# Patient Record
Sex: Female | Born: 1975 | Race: White | Hispanic: No | Marital: Married | Smoking: Current every day smoker
Health system: Southern US, Community
[De-identification: ages and names within clinical notes are randomized; demographics above are authoritative.]

## PROBLEM LIST (undated history)

## (undated) ENCOUNTER — Emergency Department (HOSPITAL_COMMUNITY): Admission: EM | Payer: Self-pay | Source: Home / Self Care

## (undated) DIAGNOSIS — F32A Depression, unspecified: Secondary | ICD-10-CM

## (undated) DIAGNOSIS — F419 Anxiety disorder, unspecified: Secondary | ICD-10-CM

## (undated) DIAGNOSIS — F329 Major depressive disorder, single episode, unspecified: Secondary | ICD-10-CM

## (undated) DIAGNOSIS — F99 Mental disorder, not otherwise specified: Secondary | ICD-10-CM

## (undated) DIAGNOSIS — F319 Bipolar disorder, unspecified: Secondary | ICD-10-CM

## (undated) HISTORY — PX: ABDOMINAL HYSTERECTOMY: SHX81

## (undated) HISTORY — PX: CHOLECYSTECTOMY: SHX55

---

## 2004-01-04 HISTORY — PX: ABDOMINAL HYSTERECTOMY: SHX81

## 2010-01-17 ENCOUNTER — Emergency Department: Payer: Self-pay | Admitting: Emergency Medicine

## 2010-04-30 ENCOUNTER — Observation Stay: Payer: Self-pay | Admitting: Internal Medicine

## 2011-02-24 ENCOUNTER — Inpatient Hospital Stay: Payer: Self-pay | Admitting: Psychiatry

## 2011-02-24 LAB — COMPREHENSIVE METABOLIC PANEL
Albumin: 3.8 g/dL (ref 3.4–5.0)
Alkaline Phosphatase: 74 U/L (ref 50–136)
Anion Gap: 13 (ref 7–16)
Bilirubin,Total: 0.4 mg/dL (ref 0.2–1.0)
Calcium, Total: 8.7 mg/dL (ref 8.5–10.1)
EGFR (African American): >60
Glucose: 99 mg/dL (ref 65–99)
Osmolality: 284 (ref 275–301)
Potassium: 3.8 mmol/L (ref 3.5–5.1)
SGOT(AST): 26 U/L (ref 15–37)
SGPT (ALT): 31 U/L
Total Protein: 8.2 g/dL (ref 6.4–8.2)

## 2011-02-24 LAB — URINALYSIS, COMPLETE
Glucose,UR: NEGATIVE mg/dL (ref 0–75)
Nitrite: NEGATIVE
Ph: 7 (ref 4.5–8.0)
Ph: 5 (ref 4.5–8.0)
Protein: 30
Specific Gravity: 1.025 (ref 1.003–1.030)
Specific Gravity: 1.03 (ref 1.003–1.030)
WBC UR: 2 /HPF (ref 0–5)

## 2011-02-24 LAB — CBC
HGB: 14.2 g/dL (ref 12.0–16.0)
MCH: 29.7 pg (ref 26.0–34.0)
Platelet: 277 10*3/uL (ref 150–440)
RBC: 4.78 10*6/uL (ref 3.80–5.20)
WBC: 13.3 10*3/uL — ABNORMAL HIGH (ref 3.6–11.0)

## 2011-02-24 LAB — DRUG SCREEN, URINE
Benzodiazepine, Ur Scrn: NEGATIVE (ref ?–200)
Cocaine Metabolite,Ur ~~LOC~~: NEGATIVE (ref ?–300)
MDMA (Ecstasy)Ur Screen: NEGATIVE (ref ?–500)

## 2011-02-24 LAB — TSH: Thyroid Stimulating Horm: 0.47 u[IU]/mL

## 2011-02-25 LAB — BEHAVIORAL MEDICINE 1 PANEL
Albumin: 3.4 g/dL (ref 3.4–5.0)
BUN: 14 mg/dL (ref 7–18)
Basophil %: 0.7 %
Bilirubin,Total: 0.4 mg/dL (ref 0.2–1.0)
Chloride: 106 mmol/L (ref 98–107)
Creatinine: 0.72 mg/dL (ref 0.60–1.30)
Eosinophil %: 3.5 %
HCT: 41.9 % (ref 35.0–47.0)
HGB: 13.9 g/dL (ref 12.0–16.0)
Monocyte #: 0.7 10*3/uL (ref 0.0–0.7)
Monocyte %: 6 %
Neutrophil %: 56.2 %
Osmolality: 280 (ref 275–301)
Potassium: 3.4 mmol/L — ABNORMAL LOW (ref 3.5–5.1)
RBC: 4.65 10*6/uL (ref 3.80–5.20)
SGOT(AST): 22 U/L (ref 15–37)
Sodium: 140 mmol/L (ref 136–145)
Thyroid Stimulating Horm: 0.555 u[IU]/mL
Total Protein: 7.3 g/dL (ref 6.4–8.2)
WBC: 11.4 10*3/uL — ABNORMAL HIGH (ref 3.6–11.0)

## 2011-03-13 ENCOUNTER — Emergency Department: Payer: Self-pay | Admitting: *Deleted

## 2011-03-13 LAB — COMPREHENSIVE METABOLIC PANEL
Albumin: 3.9 g/dL (ref 3.4–5.0)
Alkaline Phosphatase: 71 U/L (ref 50–136)
Bilirubin,Total: 0.2 mg/dL (ref 0.2–1.0)
Creatinine: 0.78 mg/dL (ref 0.60–1.30)
Glucose: 85 mg/dL (ref 65–99)
SGOT(AST): 19 U/L (ref 15–37)
SGPT (ALT): 21 U/L
Total Protein: 8 g/dL (ref 6.4–8.2)

## 2011-03-13 LAB — CBC
HCT: 40.1 % (ref 35.0–47.0)
HGB: 13.7 g/dL (ref 12.0–16.0)
Platelet: 277 10*3/uL (ref 150–440)
RDW: 14.6 % — ABNORMAL HIGH (ref 11.5–14.5)

## 2011-03-13 LAB — DRUG SCREEN, URINE
Amphetamines, Ur Screen: NEGATIVE (ref ?–1000)
Barbiturates, Ur Screen: NEGATIVE (ref ?–200)
Benzodiazepine, Ur Scrn: NEGATIVE (ref ?–200)
Cannabinoid 50 Ng, Ur ~~LOC~~: NEGATIVE (ref ?–50)
Cocaine Metabolite,Ur ~~LOC~~: NEGATIVE (ref ?–300)
MDMA (Ecstasy)Ur Screen: NEGATIVE (ref ?–500)
Tricyclic, Ur Screen: NEGATIVE (ref ?–1000)

## 2011-03-13 LAB — URINALYSIS, COMPLETE
Bacteria: NONE SEEN
Bilirubin,UR: NEGATIVE
Blood: NEGATIVE
Glucose,UR: NEGATIVE mg/dL (ref 0–75)
Ketone: NEGATIVE
Leukocyte Esterase: NEGATIVE
Protein: NEGATIVE
RBC,UR: 6 /HPF (ref 0–5)
Squamous Epithelial: 26
WBC UR: 1 /HPF (ref 0–5)

## 2011-03-13 LAB — TSH: Thyroid Stimulating Horm: 1.22 u[IU]/mL

## 2011-05-19 ENCOUNTER — Ambulatory Visit: Payer: Self-pay

## 2011-09-05 ENCOUNTER — Inpatient Hospital Stay (HOSPITAL_COMMUNITY)
Admission: AD | Admit: 2011-09-05 | Discharge: 2011-09-09 | DRG: 885 | Disposition: A | Discharge status: Home / Self Care | Payer: Federal, State, Local not specified - Other | Attending: Psychiatry | Admitting: Psychiatry

## 2011-09-05 ENCOUNTER — Encounter (HOSPITAL_COMMUNITY): Payer: Self-pay | Admitting: *Deleted

## 2011-09-05 ENCOUNTER — Emergency Department: Payer: Self-pay | Admitting: Internal Medicine

## 2011-09-05 DIAGNOSIS — F1010 Alcohol abuse, uncomplicated: Secondary | ICD-10-CM | POA: Diagnosis present

## 2011-09-05 DIAGNOSIS — Z79899 Other long term (current) drug therapy: Secondary | ICD-10-CM

## 2011-09-05 DIAGNOSIS — F319 Bipolar disorder, unspecified: Secondary | ICD-10-CM

## 2011-09-05 DIAGNOSIS — F3130 Bipolar disorder, current episode depressed, mild or moderate severity, unspecified: Principal | ICD-10-CM | POA: Diagnosis present

## 2011-09-05 DIAGNOSIS — E785 Hyperlipidemia, unspecified: Secondary | ICD-10-CM | POA: Diagnosis present

## 2011-09-05 DIAGNOSIS — E669 Obesity, unspecified: Secondary | ICD-10-CM | POA: Diagnosis present

## 2011-09-05 DIAGNOSIS — F3181 Bipolar II disorder: Secondary | ICD-10-CM | POA: Diagnosis present

## 2011-09-05 HISTORY — DX: Major depressive disorder, single episode, unspecified: F32.9

## 2011-09-05 HISTORY — DX: Mental disorder, not otherwise specified: F99

## 2011-09-05 HISTORY — DX: Depression, unspecified: F32.A

## 2011-09-05 LAB — URINALYSIS, COMPLETE
Bacteria: NONE SEEN
Glucose,UR: NEGATIVE mg/dL (ref 0–75)
Leukocyte Esterase: NEGATIVE
Nitrite: NEGATIVE

## 2011-09-05 LAB — COMPREHENSIVE METABOLIC PANEL
Albumin: 3.8 g/dL (ref 3.4–5.0)
Alkaline Phosphatase: 108 U/L (ref 50–136)
BUN: 5 mg/dL — ABNORMAL LOW (ref 7–18)
Calcium, Total: 9.2 mg/dL (ref 8.5–10.1)
Glucose: 101 mg/dL — ABNORMAL HIGH (ref 65–99)
SGOT(AST): 20 U/L (ref 15–37)
SGPT (ALT): 23 U/L (ref 12–78)
Total Protein: 8.2 g/dL (ref 6.4–8.2)

## 2011-09-05 LAB — DRUG SCREEN, URINE
Amphetamines, Ur Screen: NEGATIVE (ref ?–1000)
Barbiturates, Ur Screen: NEGATIVE (ref ?–200)
Benzodiazepine, Ur Scrn: NEGATIVE (ref ?–200)
Cocaine Metabolite,Ur ~~LOC~~: NEGATIVE (ref ?–300)
MDMA (Ecstasy)Ur Screen: NEGATIVE (ref ?–500)
Methadone, Ur Screen: NEGATIVE (ref ?–300)
Opiate, Ur Screen: NEGATIVE (ref ?–300)
Phencyclidine (PCP) Ur S: NEGATIVE (ref ?–25)

## 2011-09-05 LAB — CBC
HGB: 14.3 g/dL (ref 12.0–16.0)
MCH: 30.3 pg (ref 26.0–34.0)
MCHC: 34.4 g/dL (ref 32.0–36.0)
MCV: 88 fL (ref 80–100)
RBC: 4.72 10*6/uL (ref 3.80–5.20)

## 2011-09-05 LAB — ACETAMINOPHEN LEVEL: Acetaminophen: <2 ug/mL

## 2011-09-05 LAB — PREGNANCY, URINE: Pregnancy Test, Urine: NEGATIVE m[IU]/mL

## 2011-09-05 LAB — TSH: Thyroid Stimulating Horm: 0.643 u[IU]/mL

## 2011-09-05 MED ORDER — CHLORDIAZEPOXIDE HCL 25 MG PO CAPS: 25.0000 mg | ORAL_CAPSULE | ORAL | Status: DC

## 2011-09-05 MED ORDER — CHLORDIAZEPOXIDE HCL 25 MG PO CAPS: 25.0000 mg | ORAL_CAPSULE | Freq: Every day | ORAL | Status: DC

## 2011-09-05 MED ORDER — CHLORDIAZEPOXIDE HCL 25 MG PO CAPS: 25.0000 mg | ORAL_CAPSULE | Freq: Four times a day (QID) | ORAL | Status: AC | PRN

## 2011-09-05 MED ORDER — GABAPENTIN 600 MG PO TABS
600.0000 mg | ORAL_TABLET | Freq: Every day | ORAL | Status: DC
  Administered 2011-09-05: 600 mg via ORAL
  Filled 2011-09-05 (×3): qty 1

## 2011-09-05 MED ORDER — VENLAFAXINE HCL ER 150 MG PO CP24
150.0000 mg | ORAL_CAPSULE | Freq: Every day | ORAL | Status: DC
  Administered 2011-09-06 – 2011-09-09 (×4): 150 mg via ORAL
  Filled 2011-09-05 (×5): qty 1

## 2011-09-05 MED ORDER — CHLORDIAZEPOXIDE HCL 25 MG PO CAPS
25.0000 mg | ORAL_CAPSULE | Freq: Once | ORAL | Status: AC
  Administered 2011-09-05: 25 mg via ORAL

## 2011-09-05 MED ORDER — VITAMIN B-1 100 MG PO TABS
100.0000 mg | ORAL_TABLET | Freq: Every day | ORAL | Status: DC
  Administered 2011-09-06 – 2011-09-09 (×4): 100 mg via ORAL
  Filled 2011-09-05 (×5): qty 1

## 2011-09-05 MED ORDER — RISPERIDONE 1 MG PO TABS
1.0000 mg | ORAL_TABLET | Freq: Every day | ORAL | Status: DC
  Administered 2011-09-05: 1 mg via ORAL
  Filled 2011-09-05 (×4): qty 1

## 2011-09-05 MED ORDER — ALUM & MAG HYDROXIDE-SIMETH 200-200-20 MG/5ML PO SUSP
30.0000 mL | ORAL | Status: DC | PRN
  Administered 2011-09-08 – 2011-09-09 (×4): 30 mL via ORAL

## 2011-09-05 MED ORDER — THIAMINE HCL 100 MG/ML IJ SOLN: 100.0000 mg | Freq: Once | INTRAMUSCULAR | Status: DC

## 2011-09-05 MED ORDER — MAGNESIUM HYDROXIDE 400 MG/5ML PO SUSP: 30.0000 mL | Freq: Every day | ORAL | Status: DC | PRN

## 2011-09-05 MED ORDER — TRAZODONE HCL 100 MG PO TABS
100.0000 mg | ORAL_TABLET | Freq: Every evening | ORAL | Status: DC | PRN
  Administered 2011-09-08: 100 mg via ORAL
  Filled 2011-09-05: qty 14
  Filled 2011-09-05: qty 1

## 2011-09-05 MED ORDER — CHLORDIAZEPOXIDE HCL 25 MG PO CAPS: 25.0000 mg | ORAL_CAPSULE | Freq: Three times a day (TID) | ORAL | Status: DC

## 2011-09-05 MED ORDER — GABAPENTIN 600 MG PO TABS
300.0000 mg | ORAL_TABLET | Freq: Two times a day (BID) | ORAL | Status: DC
  Filled 2011-09-05 (×3): qty 0.5

## 2011-09-05 MED ORDER — ADULT MULTIVITAMIN W/MINERALS CH
1.0000 | ORAL_TABLET | Freq: Every day | ORAL | Status: DC
  Administered 2011-09-06 – 2011-09-09 (×4): 1 via ORAL
  Filled 2011-09-05 (×5): qty 1

## 2011-09-05 MED ORDER — ONDANSETRON 4 MG PO TBDP: 4.0000 mg | ORAL_TABLET | Freq: Four times a day (QID) | ORAL | Status: AC | PRN

## 2011-09-05 MED ORDER — CHLORDIAZEPOXIDE HCL 25 MG PO CAPS
25.0000 mg | ORAL_CAPSULE | Freq: Four times a day (QID) | ORAL | Status: DC
  Administered 2011-09-06: 25 mg via ORAL
  Filled 2011-09-05 (×2): qty 1

## 2011-09-05 MED ORDER — HYDROXYZINE HCL 25 MG PO TABS: 25.0000 mg | ORAL_TABLET | Freq: Four times a day (QID) | ORAL | Status: DC | PRN

## 2011-09-05 MED ORDER — ACETAMINOPHEN 325 MG PO TABS
650.0000 mg | ORAL_TABLET | Freq: Four times a day (QID) | ORAL | Status: DC | PRN
  Administered 2011-09-06: 650 mg via ORAL

## 2011-09-05 MED ORDER — LOPERAMIDE HCL 2 MG PO CAPS: 2.0000 mg | ORAL_CAPSULE | ORAL | Status: AC | PRN

## 2011-09-05 NOTE — BH Assessment (Signed)
Assessment Note   [name] is an 36 y.o. female. Patient brought into [facility] emergency department by husband.   Admits to drinking half a bottle of wine today, 2--40 ounce beers yesterday and 1/2 pint of vodka over the two days prior.  I can't help myself, I do nothing all day, I cook if I feel like it, I threw a coffee cup that my husband, I feel sorry for him, something's wrong with me, no one can help me.  Dr. Lenis Noon tried, but I don't listen to him at all.  Patients sat with the eyes closed the entire time she spoke current plan to kill herself by blowing her brains out and report although there is no gun at home she knows where to find one. History of self harm; One month ago she overdosed, no cutting in the past six months, no burning in the past six months. (scars on R hand and forearm). The patient is disheveled, poor hygiene, poor eye contact ,irritable, eyes closed during interview, reports difficulty falling asleep, reports mood swings, anxious, labile.  Patient is blaming, fearful and hopeless.  Patient is indecisive has some perceveration, and very poor concentration her speech is slow and deliberate her distant memory is intact but frequently asks if it is time to go home or why she is staying, agreeable when reminded. Denies having any hallucinations. Her energy is low, reports gaining 10 pounds in the past two months. She is oriented times 3. She feels that she does not have any support system.  Patients states "I don't know what happens, this feeling just comes over me, sometimes I cut myself with a box cutter or burn myself with a cigarette, it makes me feel better sometimes.  I drank 1/2 bottle of wine at 11:00 AM, sometimes it makes me feel better, I'd rather blow my brains out when I feel like this" Patient reports getting this weird feeling, it comes over me.  Patient voices suicidal ideation relating that one month ago she overdosed on a bunch of pills at  home and then just went to sleep.  Patient lives with husband of 15 years and three children age 55, 40 and 109 years old.  She relates that when she gets these weird feelings she can't stand it and drinks daily to make them go away.  Denies any family history of suicide or mental illness. The patient accepted for inpatient treatment by Morley Kos M.D.   Axis I: Alcohol Abuse and Bipolar, mixed Axis II: Deferred Axis III:  Past Medical History  Diagnosis Date  . Mental disorder   . Depression    Axis IV: other psychosocial or environmental problems and problems with primary support group Axis V: 21-30 behavior considerably influenced by delusions or hallucinations OR serious impairment in judgment, communication OR inability to function in almost all areas  Past Medical History:  Past Medical History  Diagnosis Date  . Mental disorder   . Depression     No past surgical history on file.  Family History: No family history on file.  Social History:  does not have a smoking history on file. She does not have any smokeless tobacco history on file. She reports that she drinks alcohol. She reports that she does not use illicit drugs.  Additional Social History:  Alcohol / Drug Use Pain Medications: not abusing has high salicilate level 4.4 Prescriptions: not taking risperdal and effexor recently Over the Counter: nos History of alcohol / drug  use?: Yes Substance #1 Name of Substance 1: alcohol 1 - Age of First Use: teens 1 - Amount (size/oz): varies  09/05/11-- 1/2 bottle of wine, 09/04/2011-- 2 40oz beers, 09/03/2011-- 1/2 pint vodka 1 - Frequency: most days 1 - Duration: recently 1 - Last Use / Amount: 09/05/2011  CIWA: CIWA-Ar Nausea and Vomiting: no nausea and no vomiting Tactile Disturbances: none Tremor: no tremor Auditory Disturbances: mild harshness or ability to frighten Paroxysmal Sweats: no sweat visible Visual Disturbances: not present Anxiety: three Headache,  Fullness in Head: none present Agitation: three Orientation and Clouding of Sensorium: oriented and can do serial additions CIWA-Ar Total: 8  COWS:    Allergies: Allergies no known allergies  Home Medications:  (Not in a hospital admission)  OB/GYN Status:  No LMP recorded. Patient has had a hysterectomy.  General Assessment Data Location of Assessment: [facility] Assessment Services Living Arrangements: Spouse/significant other;Children (3 children) Can pt return to current living arrangement?: Yes Admission Status: Involuntary Is patient capable of signing voluntary admission?: No Transfer from: Acute Hospital Referral Source: MD  Education Status Is patient currently in school?: No  Risk to self Suicidal Ideation: Yes-Currently Present Suicidal Intent: Yes-Currently Present Is patient at risk for suicide?: Yes Suicidal Plan?: Yes-Currently Present Specify Current Suicidal Plan: blow my brains out Access to Means: Yes Specify Access to Suicidal Means: reports she knows where to get a gun What has been your use of drugs/alcohol within the last 12 months?: alcohol when I feel like this Previous Attempts/Gestures: Yes How many times?: 5  Other Self Harm Risks: History of ODs, cutter Triggers for Past Attempts: Other (Comment) (illness--wierd feeling she can not tolerate) Intentional Self Injurious Behavior: Cutting;Burning Comment - Self Injurious Behavior: Hx of cutting with box cutter and burning with cigarettes Family Suicide History: Unknown Recent stressful life event(s):  (none reported) Persecutory voices/beliefs?: No Depression: Yes Depression Symptoms: Despondent;Insomnia;Tearfulness;Fatigue;Guilt;Feeling worthless/self pity Substance abuse history and/or treatment for substance abuse?: Yes Suicide prevention information given to non-admitted patients: Not applicable  Risk to Others Homicidal Ideation: No Thoughts of Harm to Others: No Current Homicidal Intent:  No Current Homicidal Plan: No Access to Homicidal Means: No History of harm to others?: No Assessment of Violence: None Noted Does patient have access to weapons?: Yes (Comment) (she reports knowing where a gun is) Criminal Charges Pending?: No Does patient have a court date: No  Psychosis Hallucinations: None noted Delusions: None noted  Mental Status Report Appear/Hygiene: Disheveled;Poor hygiene Eye Contact: Poor (kept eyes closed) Motor Activity: Unremarkable Speech: Soft;Logical/coherent (fearful, irritable, some perseverating) Level of Consciousness: Quiet/awake Mood: Depressed;Anxious;Labile;Irritable Affect: Anxious;Irritable;Labile;Depressed Anxiety Level: Moderate Thought Processes: Circumstantial Judgement: Impaired Orientation: Person;Place;Time Obsessive Compulsive Thoughts/Behaviors: None  Cognitive Functioning Concentration: Decreased Memory: Remote Intact;Recent Impaired (trouble remembering why she can not go home) IQ: Average Insight: Poor Impulse Control: Poor Appetite: Good Weight Loss: 0  Weight Gain: 10  (in 2 months) Sleep: Decreased Total Hours of Sleep: 6  Vegetative Symptoms: Not bathing;Decreased grooming  ADLScreening [facility] Assessment Services) Patient's cognitive ability adequate to safely complete daily activities?: Yes Patient able to express need for assistance with ADLs?: Yes Independently performs ADLs?: Yes (appropriate for developmental age)     Prior Inpatient Therapy Prior Inpatient Therapy: Yes Prior Therapy Dates: nos Prior Therapy Facilty/Provider(s): nos Reason for Treatment: SI  Prior Outpatient Therapy Prior Outpatient Therapy: Yes Prior Therapy Dates: current Prior Therapy Facilty/Provider(s): [facility]/ Dr Lenis Noon Reason for Treatment: mood disorder, bipolar  ADL Screening (condition at time of admission) Patient's  cognitive ability adequate to safely complete daily activities?: Yes Patient able to express need for  assistance with ADLs?: Yes Independently performs ADLs?: Yes (appropriate for developmental age) Weakness of Legs: None Weakness of Arms/Hands: None  Home Assistive Devices/Equipment Home Assistive Devices/Equipment: None          Advance Directives (For Healthcare) Advance Directive: Patient does not have advance directive Pre-existing out of facility DNR order (yellow form or pink MOST form): No Nutrition Screen- [facility] Adult/WL/AP Patient's home diet: Regular Have you recently lost weight without trying?: No Have you been eating poorly because of a decreased appetite?: No Malnutrition Screening Tool Score: 0   Additional Information 1:1 In Past 12 Months?:  (current in ED) CIRT Risk: No Elopement Risk: No Does patient have medical clearance?: Yes     Disposition:  Disposition Disposition of Patient: Inpatient treatment program Type of inpatient treatment program: Adult  On Site Evaluation by:   Reviewed with Physician:     Conan Bowens 09/05/2011 8:07 PM

## 2011-09-05 NOTE — Tx Team (Signed)
Initial Interdisciplinary Treatment Plan  PATIENT STRENGTHS: (choose at least two) Ability for insight Active sense of humor Average or above average intelligence Capable of independent living Communication skills General fund of knowledge Motivation for treatment/growth Physical Health Religious Affiliation Supportive family/friends Work skills  PATIENT STRESSORS: Financial difficulties Loss of dad 3 yrs ago* Medication change or noncompliance Occupational concerns Substance abuse   PROBLEM LIST: Problem List/Patient Goals Date to be addressed Date deferred Reason deferred Estimated date of resolution  "I just wanna feel good for one day" 09/05/11     "My ability to cope with things, basic every day things" 09/05/11           Anxiety 09/05/11     depression 09/05/11     Increased risk for suicide 09/05/11     etoh dependence 09/05/11                  DISCHARGE CRITERIA:  Ability to meet basic life and health needs Adequate post-discharge living arrangements Improved stabilization in mood, thinking, and/or behavior Medical problems require only outpatient monitoring Motivation to continue treatment in a less acute level of care Need for constant or close observation no longer present  PRELIMINARY DISCHARGE PLAN: Attend aftercare/continuing care group Attend 12-step recovery group Outpatient therapy Participate in family therapy Return to previous living arrangement Return to previous work or school arrangements  PATIENT/FAMIILY INVOLVEMENT: This treatment plan has been presented to and reviewed with the patient, [name], and/or family member.  The patient and family have been given the opportunity to ask questions and make suggestions.  Fr[facility] 09/05/2011, 9:38 PM

## 2011-09-06 DIAGNOSIS — F3181 Bipolar II disorder: Secondary | ICD-10-CM | POA: Diagnosis present

## 2011-09-06 LAB — BASIC METABOLIC PANEL
BUN: 11 mg/dL (ref 6–23)
Calcium: 9.3 mg/dL (ref 8.4–10.5)
GFR calc non Af Amer: >90 mL/min (ref >90)
Glucose, Bld: 96 mg/dL (ref 70–99)

## 2011-09-06 LAB — HEPATIC FUNCTION PANEL
ALT: 13 U/L (ref 0–35)
AST: 17 U/L (ref 0–37)
Albumin: 3.5 g/dL (ref 3.5–5.2)
Alkaline Phosphatase: 83 U/L (ref 39–117)
Total Bilirubin: 0.2 mg/dL — ABNORMAL LOW (ref 0.3–1.2)

## 2011-09-06 MED ORDER — DIVALPROEX SODIUM ER 500 MG PO TB24
500.0000 mg | ORAL_TABLET | Freq: Every day | ORAL | Status: DC
  Administered 2011-09-06 – 2011-09-09 (×4): 500 mg via ORAL
  Filled 2011-09-06 (×6): qty 1

## 2011-09-06 MED ORDER — NICOTINE 21 MG/24HR TD PT24
21.0000 mg | MEDICATED_PATCH | Freq: Every day | TRANSDERMAL | Status: DC
  Administered 2011-09-06 – 2011-09-09 (×4): 21 mg via TRANSDERMAL
  Filled 2011-09-06 (×6): qty 1

## 2011-09-06 MED ORDER — GABAPENTIN 300 MG PO CAPS
600.0000 mg | ORAL_CAPSULE | Freq: Every day | ORAL | Status: DC
  Administered 2011-09-06 – 2011-09-08 (×3): 600 mg via ORAL
  Filled 2011-09-06 (×4): qty 2

## 2011-09-06 MED ORDER — GABAPENTIN 300 MG PO CAPS
300.0000 mg | ORAL_CAPSULE | Freq: Two times a day (BID) | ORAL | Status: DC
  Administered 2011-09-06 – 2011-09-09 (×8): 300 mg via ORAL
  Filled 2011-09-06 (×9): qty 1

## 2011-09-06 MED ORDER — RISPERIDONE 1 MG PO TABS
1.0000 mg | ORAL_TABLET | Freq: Two times a day (BID) | ORAL | Status: DC
  Administered 2011-09-06 – 2011-09-09 (×7): 1 mg via ORAL
  Filled 2011-09-06 (×8): qty 1

## 2011-09-06 NOTE — Progress Notes (Signed)
Patient ID: [name], female   DOB: [birth year], 36 y.o.   MRN: [id number] D: Pt is awake and active on the unit this PM. Pt denies SI/HI and A/V hallucinations. She endorses thoughts of self harm but is able to contracts for safety. Pt is participating in the milieu and is cooperative with staff. Pt rates their depression at 5 and hopelessness at 8. Pt's most recent CIWA score was 3.  A: Writer offered self, utilized therapeutic communication and administered medication per MD orders. Writer also encouraged pt to discuss feelings with staff and attend groups. Pt states that she is feeling better than yesterday but is still anxious. When she has had thoughts of self harm in the past she has cut or burned herself. Writer encouraged her to learn new coping skills here at [facility] to deal with emotional turmoil such as exercise instead of self harm.   R: Pt is attending groups and tolerating medications well. Writer will continue to monitor. 15 minute checks are ongoing for safety. Pt is receptive to staff input as well.

## 2011-09-06 NOTE — Progress Notes (Signed)
[facility] Group Notes:  (Counselor/Nursing/MHT/Case Management/Adjunct)   Type of Therapy:  Group Therapy 1:15 to 2:30 Tuesday 09/06/2011   Participation Level:  Active  Participation Quality:  Appropriate and Attentive  Affect:  Appropriate  Cognitive:  Appropriate  Insight:  Good  Engagement in Group:  Good  Engagement in Therapy:  Good  Modes of Intervention:  Education, Socialization and Support  Summary of Progress/Problems:Patient attended group presentation by Interior and spatial designer of  [employer] ([employer]). [name] was attentive and appropriate during session, her body language showed that she was able to relate to many aspects of guest speakers story.  During processing portion [name] shared how she was able to relate to description shared about bipolar experience.   Clide Dales 09/07/2011 10:52 AM

## 2011-09-06 NOTE — [facility] Suicide Risk Assessment (Signed)
Suicide Risk Assessment  Admission Assessment      Demographic factors:  Caucasian;Low socioeconomic status  Current Mental Status: Patient seen and evaluated. Chart reviewed. Patient stated that her mood was "ok".  Off meds in recent past.  Was taking Risperdal, Effexor, Lamotrigine and Neurontin. Her affect was mood congruent and labile. She denied any current thoughts of self injurious behavior, suicidal ideation or homicidal ideation at this time.  Hx agitation, SIB and aggression. There were no auditory or visual hallucinations, paranoia, delusional thought processes, or mania noted.  Thought process was linear and goal directed.  Mild psychomotor agitation noted. Speech was normal rate, tone and volume. Eye contact was good. Judgment and insight are limited.  Patient has been up and engaged on the unit.  No acute safety concerns reported from team.  No sig withdrawal s/s noted at this time, pt requested d/c of librium taper.  Loss Factors:  Financial problems / change in socioeconomic status;Decline in physical health (lost dad 3 yrs ago)  Historical Factors:  Impulsivity; Hx SI/attempts/SIB   Risk Reduction Factors:  Responsible for children under 43 years of age;Sense of responsibility to family;Religious beliefs about death;Employed;Positive social support;Living with another person, especially a relative; husband  CLINICAL FACTORS: BPAD; Alcohol Use Disorder; r/o PD NOS with Cluster B Traits  COGNITIVE FEATURES THAT CONTRIBUTE TO RISK: limited insight; impulsivity.  SUICIDE RISK: Pt viewed as a chronic increased risk of harm to self in light of her past hx and risk factors.  No acute safety concerns on the unit.  Pt contracting for safety and in need of crisis stabilization & Tx.  PLAN OF CARE: Pt admitted for crisis stabilization and treatment. PT requested start of Depakote over restart of Lamotrigine and increase in Risperdal for acute agitation.  Views Effexor and Neurontin as  helpful.  Please see orders.   Medications reviewed with pt and medication education provided. Will continue q15 minute checks per unit protocol.  No clinical indication for one on one level of observation at this time.  Pt contracting for safety.  Mental health treatment, medication management and continued sobriety will mitigate against the increased risk of harm to self and/or others.  Discussed the importance of recovery with pt, as well as, tools to move forward in a healthy & safe manner.  Pt agreeable with the plan.  Discussed with the team.   Lupe Carney 09/06/2011, 4:37 PM

## 2011-09-06 NOTE — Progress Notes (Signed)
[facility] Group Notes:  (Counselor/Nursing/MHT/Case Management/Adjunct)  09/06/2011 3:28 PM  Type of Therapy:  Psychoeducational Skills  Participation Level:  Did Not Attend  Summary of Progress/Problems: Jahliyah did not attend Psychoeducational group that focused on using quality time with support systems/individuals to engage in healthy coping skills.   Wandra Scot 09/06/2011, 3:28 PM

## 2011-09-06 NOTE — Progress Notes (Signed)
Patient ID: [name], female   DOB: 1975/10/31, 36 y.o.   MRN: [id number] She has been up and to most of the groups, interacting with peers and staff. sSays that she is feeling better at this time. On her self inventory this AM she indicated that she was having SI thoughts but when asked about in treatment  Team she denied it.  Has depression  And hopelessness of 5.

## 2011-09-06 NOTE — Progress Notes (Signed)
Psychoeducational Group Note  Date:  09/06/2011 Time:  1000  Group Topic/Focus:  Rediscovering Joy:   The focus of this group is to explore various ways to relieve stress in a positive manner.  Participation Level:  Active  Participation Quality:  Appropriate, Attentive and Sharing  Affect:  Appropriate  Cognitive:  Appropriate  Insight:  Good  Engagement in Group:  Good  Additional Comments:  Pt participated in recovery goals group. Pt defined recovery and what it would look like in his life. Pt was given a worksheet on my personal goals for recovery, and wrote down two changes that would be made in order to progress in recovery and what could be done differently that would promote healthy behaviors.  Pt stated that she would enjoy her hobbies more to keep herself busy.   [name] Brittini 09/06/2011, 2:57 PM
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
## 2011-09-06 NOTE — Progress Notes (Signed)
Patient ID: [name], female   DOB: 1975-04-02, 36 y.o.   MRN: [id number]  Pt was pleasant and cooperative, but anxious with pressured speech during the adm process. Pt stated she told her husband that she wanted help. Stated she made a remark referring to drastic measures rather than feel the way she has been feeling. Stated that she's always anxious and sometimes feels as if she's coming out of her skin. "I feel like people are out to get me and stress me out. Informed the writer that she was admitted invol because she decided she wanted to leave. Pt is a poor historian and somewhat guarded during the adm process.

## 2011-09-06 NOTE — H&P (Signed)
Psychiatric Admission Assessment Adult  Patient Identification:  [name] Date of Evaluation:  09/06/2011 Chief Complaint:  Bipolar Disorder, Manic History of Present Illness:: Pt is a 36 y/o WF, accepted from [facility] for inpatient mgmt of bipolar d/o (mixed) with suggestive manic episodes in setting of Alcohol abuse. Pt comes IVC and was medically screened and cleared per [place] HCP. Pt presents with continued manic events which are impulsive actions,that include mood swings, irritability, violent behaviors between her and her husband, crying spells and cutting on  herself to distract from the impulsiveness and mania that follows. Pt states she has had SI with intent/plan i.e. taking an O/D of medications. Pt denies HI, delusional thoughts or AVH and states she can contract for safety. Pt states she also periodically consumes increased volumes of alcohol to treat her mania in prior to cutting on herself.on ranges from a pint to a 5th of liquor consumed over two days with beer consumed alternately. Pt denies h.o of Dt's or S/z d/o related to her alcohol use. Pt sees Dr Lenis Noon at [facility] as outpatient and admittedly hasn't been compliant with her Rx psychotropic medications due to her medicaid running out. Pt is feeling depressed and admits to feelings of hopelessness, worthlessness and guilt. Pt also has periods of racing thoughts and decreased concentration, anhedonia and rates her depression sx a 5/10. Pt rates her anxiety sx a 5/10 and endorses periodic panic attacks. Reportedly pt stated to a nurse at [facility] that she felt like she could take a gun and just blow her brains out in lue of her stating she could contract for safety and denying SI/SA at this time.  ROS:   Psych : see HPI Endo: endorses HLD, obesity, denies DM, Thyroid d/z Pulm: denies SOB, cough, TB, PNA, hemoptysis Cardio: denies Htn, Cad, Murmur, CHF GI: denies N/V/D, pud, GERD, IBS, IBD Integument: denies rash,  lymphedema, skin CA  As per HPI, rest 12 point ROS negative  Mood Symptoms:  Anhedonia, Energy, Guilt, Helplessness, Hopelessness, Depression Symptoms:  depressed mood, difficulty concentrating, hopelessness, suicidal thoughts with specific plan, (Hypo) Manic Symptoms:  Elevated Mood, Impulsivity, Anxiety Symptoms:  Panic Symptoms, Psychotic Symptoms:  Paranoia,  PTSD Symptoms: none  Past Psychiatric History: Diagnosis:Bipolar D/o  Hospitalizations:  Outpatient Care:  Substance Abuse Care:  Self-Mutilation:  Suicidal Attempts:  Violent Behaviors:   Past Medical History:   Past Medical History  Diagnosis Date  . Mental disorder   . Depression    None. Allergies:  No Known Allergies PTA Medications: Prescriptions prior to admission  Medication Sig Dispense Refill  . risperiDONE (RISPERDAL) 1 MG tablet Take 1 mg by mouth at bedtime.      Marland Kitchen venlafaxine XR (EFFEXOR-XR) 150 MG 24 hr capsule Take 150 mg by mouth daily.      Marland Kitchen gabapentin (NEURONTIN) 300 MG capsule Take 300 mg by mouth 2 (two) times daily at 10 am and 4 pm.      . gabapentin (NEURONTIN) 600 MG tablet Take 600 mg by mouth at bedtime.      . lamoTRIgine (LAMICTAL) 100 MG tablet Take 100 mg by mouth daily.      . traZODone (DESYREL) 100 MG tablet Take 100 mg by mouth at bedtime as needed.        Previous Psychotropic Medications:  Medication/Dose                 Substance Abuse History in the last 12 months: Substance Age of 1st Use Last Use  Amount Specific Type  Nicotine      Alcohol      Cannabis      Opiates      Cocaine      Methamphetamines      LSD      Ecstasy      Benzodiazepines      Caffeine      Inhalants      Others:                         Consequences of Substance Abuse: Pt denies any consequences from increased alcohol consequences   Social History: Current Place of Residence:   Place of Birth:   Family Members: Marital Status:   Married Children:  Sons:  Daughters: Relationships: Education:  Corporate treasurer Problems/Performance: Religious Beliefs/Practices: History of Abuse (Emotional/Phsycial/Sexual) Occupational Experiences; Military History:  None. Legal History: Hobbies/Interests:  Family History:  No family history on file.  Mental Status Examination/Evaluation: Objective:  Appearance: Bizarre  Eye Contact::  Minimal  Speech:  Garbled  Volume:  Normal  Mood:  Anxious, Depressed, Hopeless and Worthless  Affect:  Depressed  Thought Process:  Disorganized  Orientation:  Full  Thought Content:  Rumination  Suicidal Thoughts:  Yes.  with intent/plan  Homicidal Thoughts:  No  Memory:  Immediate;   Fair  Judgement:  Poor  Insight:  Lacking  Psychomotor Activity:  Normal  Concentration:  Poor  Recall:  Poor  Akathisia:  No  Handed:  Right  AIMS (if indicated):     Assets:  Resilience  Sleep:       Laboratory/X-Ray Psychological Evaluation(s)      Assessment:    AXIS I:  Bipolar, Manic and alcohol abuse AXIS II:  No diagnosis AXIS III:  HLD Past Medical History  Diagnosis Date  . Mental disorder   . Depression    AXIS IV:  other psychosocial or environmental problems AXIS V:  11-20 some danger of hurting self or others possible OR occasionally fails to maintain minimal personal hygiene OR gross impairment in communication  Treatment Plan/Recommendations: 1) admit under safety and stability under IVC orders 2) substance abuse counseling and Alcohol detox and needed, 3) inpatient psychotherapy with psychotropics as deemed necessary     Treatment Plan Summary: Daily contact with patient to assess and evaluate symptoms and progress in treatment Current Medications:  Current Facility-Administered Medications  Medication Dose Route Frequency Provider Last Rate Last Dose  . acetaminophen (TYLENOL) tablet 650 mg  650 mg Oral Q6H PRN Alyson Kuroski-Mazzei, DO      . alum & mag  hydroxide-simeth (MAALOX/MYLANTA) 200-200-20 MG/5ML suspension 30 mL  30 mL Oral Q4H PRN Mickeal Skinner, MD      . chlordiazePOXIDE (LIBRIUM) capsule 25 mg  25 mg Oral Q6H PRN Alyson Kuroski-Mazzei, DO      . chlordiazePOXIDE (LIBRIUM) capsule 25 mg  25 mg Oral Once Mickeal Skinner, MD   25 mg at 09/05/11 2237  . chlordiazePOXIDE (LIBRIUM) capsule 25 mg  25 mg Oral QID Alyson Kuroski-Mazzei, DO       Followed by  . chlordiazePOXIDE (LIBRIUM) capsule 25 mg  25 mg Oral TID Alyson Kuroski-Mazzei, DO       Followed by  . chlordiazePOXIDE (LIBRIUM) capsule 25 mg  25 mg Oral BH-qamhs Alyson Kuroski-Mazzei, DO       Followed by  . chlordiazePOXIDE (LIBRIUM) capsule 25 mg  25 mg Oral Daily Alyson Kuroski-Mazzei, DO      .  gabapentin (NEURONTIN) tablet 300 mg  300 mg Oral BID Mickeal Skinner, MD      . gabapentin (NEURONTIN) tablet 600 mg  600 mg Oral QHS Alyson Kuroski-Mazzei, DO   600 mg at 09/05/11 2237  . hydrOXYzine (ATARAX/VISTARIL) tablet 25 mg  25 mg Oral Q6H PRN Alyson Kuroski-Mazzei, DO      . loperamide (IMODIUM) capsule 2-4 mg  2-4 mg Oral PRN Alyson Kuroski-Mazzei, DO      . magnesium hydroxide (MILK OF MAGNESIA) suspension 30 mL  30 mL Oral Daily PRN Alyson Kuroski-Mazzei, DO      . multivitamin with minerals tablet 1 tablet  1 tablet Oral Daily Mickeal Skinner, MD      . ondansetron (ZOFRAN-ODT) disintegrating tablet 4 mg  4 mg Oral Q6H PRN Alyson Kuroski-Mazzei, DO      . risperiDONE (RISPERDAL) tablet 1 mg  1 mg Oral QHS Alyson Kuroski-Mazzei, DO   1 mg at 09/05/11 2237  . thiamine (B-1) injection 100 mg  100 mg Intramuscular Once Mickeal Skinner, MD      . thiamine (VITAMIN B-1) tablet 100 mg  100 mg Oral Daily Mickeal Skinner, MD      . traZODone (DESYREL) tablet 100 mg  100 mg Oral QHS PRN Alyson Kuroski-Mazzei, DO      . venlafaxine XR (EFFEXOR-XR) 24 hr capsule 150 mg  150 mg Oral Q breakfast Mickeal Skinner, MD        Observation Level/Precautions:  Per unit protocol  Laboratory:   Vitamin B-12 asa, acetaminophen, lfts, bmp in am  Psychotherapy:    Medications:    Routine PRN Medications:  Yes  Consultations:    Discharge Concerns:    Other:     Zared Knoth E 9/3/201312:22 AM

## 2011-09-06 NOTE — Progress Notes (Signed)
[facility] Adult Inpatient Family/Significant Other Collateral Contact  Collateral Contact:  Education Completed; Sonya Gunnoe at [phone] has been identified by the patient as the family member/significant other who can provide for collateral information. With written consent from the patient, the family member/significant other has been contacted 09/06/2011 at 1:10 PM and reports:   [name] was pleased to hear from facility as was never contacted by other inpatient facility  When asked for input on decision of which hall for [name] to be on he shared belief 500 may be best although he hopes we will remain cognizant of the fact patient does use alcohol to cope with bipolar symptoms every 6-8 weeks.   Writer will call back to go over suicide prevention education as Clinical research associate had group session in less than 5 minutes.  Clide Dales 09/06/2011 3:40 PM

## 2011-09-06 NOTE — Progress Notes (Signed)
09/06/2011         Time: 1500      Group Topic/Focus: The focus of the group is on enhancing the patients' ability to utilize positive relaxation strategies by practicing several that can be used at discharge.  Participation Level: Active  Participation Quality: Appropriate and Attentive  Affect: Blunted  Cognitive: Oriented   Additional Comments: Patient practiced relaxation techniques, was able to identify positive techniques she can use upon discharge to help relax.   [name] 09/06/2011 3:50 PM

## 2011-09-06 NOTE — Treatment Plan (Signed)
Interdisciplinary Treatment Plan Update (Adult)  Date: 09/06/2011  Time Reviewed: 11:35 AM   Progress in Treatment: Attending groups: Yes Participating in groups: Yes Taking medication as prescribed: Yes Tolerating medication: Yes   Family/Significant other contact made: Yes  Patient understands diagnosis:  Yes As evidenced by asking for help with mood instability Discussing patient identified problems/goals with staff:  Yes  See below Medical problems stabilized or resolved:  Yes Denies suicidal/homicidal ideation: Yes  Marked yes on self inventory, but denies in tx team Issues/concerns per patient self-inventory:  Yes  Depression 5, hopelessness 8  SI  Headache Other:  New problem(s) identified: N/A  Reason for Continuation of Hospitalization: Anxiety Depression Medication stabilization  Interventions implemented related to continuation of hospitalization: d/c librium taper,  Counselor to contact husband to find out his level of concern of alcohol usage  Evaluate for move to 500 hall  Depakote trial  Encourage group attendance and participation  Additional comments:  Estimated length of stay:3-4 days  Discharge Plan:return home, follow up outpt  New goal(s): N/A  Review of initial/current patient goals per problem list:   1.  Goal(s): Eliminate SI  Met:  Yes  Target date:9/3  As evidenced ZO:[id number] report in tx team  2.  Goal (s): Stabilize mood  Met:  No  Target date:9/6  As evidenced EA:[id number] will rate her depression and anxiety at 4 or less  3.  Goal(s):Identify comprehensive sobriety plan  Met:  No  Target date:9/6  As evidenced XB:JYNW report  4.  Goal(s):  Met:  No  Target date:  As evidenced by:  Attendees: Patient:  [name] 09/06/2011 11:35 AM  Family:     Physician:  Lupe Carney 09/06/2011 11:35 AM   Nursing: Roswell Miners   09/06/2011 11:35 AM   Case Manager:  Richelle Ito, LCSW 09/06/2011 11:35 AM   Counselor:  Ronda Fairly,  LCSWA 09/06/2011 11:35 AM   Other:     Other:     Other:     Other:      Scribe for Treatment Team:   Ida Rogue, 09/06/2011 11:35 AM

## 2011-09-06 NOTE — H&P (Signed)
Pt seen and evaluated upon admission.  Completed Admission Suicide Risk Assessment.  See orders.  Pt agreeable with plan.  Discussed with team.   

## 2011-09-06 NOTE — Progress Notes (Signed)
D: Pt in bed resting with eyes closed. Respirations even and unlabored. Pt appears to be in no signs of distress at this time. A: Q15min checks remains for this pt. R: Pt remains safe at this time.   

## 2011-09-07 NOTE — Progress Notes (Signed)
Psychoeducational Group Note  Date:  09/07/2011 Time:  1100  Group Topic/Focus:  Personal Choices and Values:   The focus of this group is to help patients assess and explore the importance of values in their lives, how their values affect their decisions, how they express their values and what opposes their expression.  Participation Level:  Active  Participation Quality:  Appropriate, Attentive and Sharing  Affect:  Appropriate  Cognitive:  Alert and Appropriate  Insight:  Good  Engagement in Group:  Good  Additional Comments:  Pt was appropriate and sharing while attending group. Pt shared that being a good parent and being emotionally stable are two important values in his life. Pt believes that if she is emotionally stable than she be a better parent to her children. Pt also stated that she planned to display her bad habits in front of her children.  Sharyn Lull 09/07/2011, 1:10 PM

## 2011-09-07 NOTE — Progress Notes (Signed)
Psychoeducational Group Note  Date:  09/07/2011 Time:  2000  Group Topic/Focus:  Wrap-Up Group:   The focus of this group is to help patients review their daily goal of treatment and discuss progress on daily workbooks.  Participation Level:  Active  Participation Quality:  Appropriate  Affect:  Appropriate  Cognitive:  Alert  Insight:  Good  Engagement in Group:  Good  Additional Comments:  Pt stated that she is her for medication adjustment. She had a good day overall.  [name] 09/07/2011, 9:39 PM

## 2011-09-07 NOTE — Discharge Planning (Signed)
Pt attended morning group session. Pt slept well and seemed energetic with a pleasant affect. She stated that she has been feeling a lot better over the past two days. Pt would like to transfer to the 500 hall and feels that her problem with alcohol is secondary to her mental health issues. Pt explained that she has experienced severe mood changes since her teenage years and would typically hurt herself or drink in order to "calm down." She stated that she does not like to leave her house but had been attending coping skills groups at the [facility] in [place] until she lost her motivation to go. Pt said that she is aware of what she needs to do but struggles with the motivation to follow through. CM intern called [facility] to verify that [place] is a pt there. Pt was d/c from their services after failing to come to weekly group sessions but is able to attend sessions beginning Tuesday at 2:30pm. Pt has appt with doctor on Oct.8 at 1:30 at [facility]. CM intern made doctor appt at [facility] for Sept 11 at 11am.

## 2011-09-07 NOTE — Progress Notes (Signed)
Read and reviewed. 

## 2011-09-07 NOTE — Progress Notes (Signed)
09/07/2011         Time: 1500      Group Topic/Focus: The focus of this group is on enhancing the patient's understanding of leisure, barriers to leisure, and the importance of engaging in positive leisure activities upon discharge for improved total health.  Participation Level: Active  Participation Quality: Appropriate and Attentive  Affect: Appropriate  Cognitive: Alert   Additional Comments: Patient able to identify leisure interests she can engage in upon discharge.   Venice Liz 09/07/2011 3:41 PM 

## 2011-09-07 NOTE — Progress Notes (Signed)
D: Patient in day room on approach.  Patient states she feels fine and she has been on the 300 hall prior to coming to the 500 hall.  Patient states she has learned many things while being admitted.  Patient states she has to make sure she is taking her medications.  Patient denies SI/HI and denies AVH. No complaints at this time. A: Staff to monitor Q 15 mins for safety.  Support and encouragement offered.  Medications administered per orders. R: Patient remains safe on the unit.  Patient attended wrap up group tonight.  Patient has been visible in milieu, calm and cooperative.  Taking all administered medications.

## 2011-09-07 NOTE — Progress Notes (Signed)
[facility] In Patient Progress Note  09/07/2011 1:47 PM [name] 1975-06-04 [id number]  Diagnosis:  Axis I: BPAD; Alcohol Use Disorder; r/o PD NOS with Cluster B Traits   ADL's:  Intact  Sleep:  Yes,  AEB:  Appetite: good  Suicidal Ideation: No suicidal ideation, no plan, no intent, no means.  Homicidal Ideation:  No homicidal ideation, no plan, no intent, no means.  Subjective:  [name] states she is doing well today and doesn't really know why she is here in the [facility]. She reports she does not abuse alcohol or drugs but drank wine inorder to help her cope with her uncontrollable rages she has.  She denies any withdrawal symptoms and feels that she could go home.   height is 5\' 8"  (1.727 m) and weight is 95.255 kg (210 lb). Her oral temperature is 98 F (36.7 C). Her blood pressure is 115/84 and her pulse is 96. Her respiration is 18.   Objective: Her insight and judgement are poor, as she does not seem to have a good grasp on her behavior when she is not in the hospital.  She does not feel that she abuses drugs or alcohol and does not know why she is in the hospital at this time.  Mental Status: Level of Consciousness:  Alert Orientation: x 3 General Appearance : casual dress, fairly groomed Behavior:   cooperative Eye Contact:  Good Motor Behavior:  Normal Speech:  Normal Mood:  Euthymic Affect:  Appropriate Anxiety Level:  None Thought Process:  Coherent Thought Content:  WNL Perception:  Normal Judgment:  Poor Insight:  Absent Cognition:  At least average Sleep:  Number of Hours: 6.5   Lab Results:  Results for orders placed during the hospital encounter of 09/05/11 (from the past 48 hour(s))  HEPATIC FUNCTION PANEL     Status: Abnormal   Collection Time   09/06/11  1:15 AM      Component Value Range Comment   Total Protein 7.2  6.0 - 8.3 g/dL    Albumin 3.5  3.5 - 5.2 g/dL    AST 17  0 - 37 U/L    ALT 13  0 - 35 U/L    Alkaline Phosphatase 83  39 - 117 U/L    Total Bilirubin 0.2 (*) 0.3 - 1.2 mg/dL    Bilirubin, Direct <4.0  0.0 - 0.3 mg/dL    Indirect Bilirubin NOT CALCULATED  0.3 - 0.9 mg/dL   BASIC METABOLIC PANEL     Status: Normal   Collection Time   09/06/11  1:15 AM      Component Value Range Comment   Sodium 136  135 - 145 mEq/L    Potassium 3.9  3.5 - 5.1 mEq/L    Chloride 99  96 - 112 mEq/L    CO2 30  19 - 32 mEq/L    Glucose, Bld 96  70 - 99 mg/dL    BUN 11  6 - 23 mg/dL    Creatinine, Ser 9.81  0.50 - 1.10 mg/dL    Calcium 9.3  8.4 - 19.1 mg/dL    GFR calc non Af Amer >90  >90 mL/min    GFR calc Af Amer >90  >90 mL/min   SALICYLATE LEVEL     Status: Abnormal   Collection Time   09/06/11  1:15 AM      Component Value Range Comment   Salicylate Lvl <2.0 (*) 2.8 - 20.0 mg/dL   ACETAMINOPHEN LEVEL  Status: Normal   Collection Time   09/06/11  1:15 AM      Component Value Range Comment   Acetaminophen (Tylenol), Serum <15.0  10 - 30 ug/mL    Labs are reviewed. Physical Findings: AIMS: Facial and Oral Movements Muscles of Facial Expression: None, normal Lips and Perioral Area: None, normal Jaw: None, normal Tongue: None, normal,Extremity Movements Upper (arms, wrists, hands, fingers): None, normal Lower (legs, knees, ankles, toes): None, normal, Trunk Movements Neck, shoulders, hips: None, normal, Overall Severity Severity of abnormal movements (highest score from questions above): None, normal Incapacitation due to abnormal movements: None, normal Patient's awareness of abnormal movements (rate only patient's report): No Awareness, Dental Status Current problems with teeth and/or dentures?: Yes Does patient usually wear dentures?: Yes  CIWA:  CIWA-Ar Total: 0  COWS:    Medication:   . divalproex  500 mg Oral Daily  . gabapentin  300 mg Oral BID  . gabapentin  600 mg Oral QHS  . multivitamin with minerals  1 tablet Oral Daily  . nicotine  21 mg Transdermal Q0600  . risperiDONE  1 mg Oral BID  . thiamine  100 mg  Intramuscular Once  . thiamine  100 mg Oral Daily  . venlafaxine XR  150 mg Oral Q breakfast  . DISCONTD: chlordiazePOXIDE  25 mg Oral QID  . DISCONTD: chlordiazePOXIDE  25 mg Oral TID  . DISCONTD: chlordiazePOXIDE  25 mg Oral BH-qamhs  . DISCONTD: chlordiazePOXIDE  25 mg Oral Daily  . DISCONTD: risperiDONE  1 mg Oral QHS   Treatment Plan Summary: Daily contact with patient to assess and evaluate symptoms and progress in treatment Medication management  Plan: 1. Admit for crisis management and stabilization. 2. Medication management to reduce current symptoms to base line and improve the patient's overall level of functioning 3. Treat health problems as indicated. 4. Develop treatment plan to decrease risk of relapse upon discharge and the need for readmission. 5. Psycho-social education regarding relapse prevention and self care. 6. Health care follow up as needed for medical problems. 7. Restart home medications where appropriate. charges 8. Move patient to [address] Hall for mood disorders. 9. Continue trazodone for sleep 10. Depakote 500mg  po at hs for mood stability. 11. Continue Gabapentin 300mg  BID and 600mg  at hs. 12. Risperidone 1mg  po BID. 13. Continue Venlafaxine Xr 150mg  capsule. Rona Ravens. Gautham Hewins PAC 09/07/2011, 1:47 PM

## 2011-09-08 DIAGNOSIS — F319 Bipolar disorder, unspecified: Secondary | ICD-10-CM

## 2011-09-08 NOTE — Progress Notes (Signed)
D: Patient calm and cooperative.  Patient states she was tearful earlier during the day because she thought she would be going home.  Patient states she missed her son's football game tonight.  Patient states that she is now ok with not being discharged and states hopefully she will be discharged tomorrow.  Patient rates depression 0/10 and denies SI/HI and denies AVH.  Patient states she has learned that she has to take her medications as prescribed. PT also states that she needs to enjoy life and things could be worse.  Patient is hopeful that she is going home tomorrow.  A: Staff to monitor Q 15 mins for safety.  Writer offered encouragement and support.  Scheduled medications administered and maalox administered prn for flatulence, trazodone administered prn for insomnia.  R: Patient remains safe on the unit.  Patient attended karaoke group tonight and patient states she sang. Patient calm and cooperative and taking administered medications.

## 2011-09-08 NOTE — Progress Notes (Signed)
Pt attended discharge planning group and actively participated in group.  SW provided pt with today's workbook.  Pt presents with calm mood and affect.  Pt rates depression and anxiety at a 1 today.  Pt denies SI.  Pt reports feeling stable to d/c soon.  Pt states that she feels the medication is really helping her and states that she feels her mood is more stable.  Pt states that she lives in [place] with her husband and 3 kids.  Pt states that she has transportation home.  Pt has follow up in place for medication management and therapy.  No further needs voiced by pt at this time.  Safety planning and suicide prevention discussed.  Pt participated in discussion and acknowledged an understanding of the information provided.       [name], LCSWA 09/08/2011  10:02 AM

## 2011-09-08 NOTE — Progress Notes (Signed)
[facility] Group Notes:  (Counselor/Nursing/MHT/Case Management/Adjunct)  09/08/2011 11:00 AM  Type of Therapy:  Group Therapy 1:15 to 2:30 PM 09/07/11  Participation Level:  Minimal  Participation Quality:  Drowsy  Affect:  Flat  Cognitive:  Oriented  Insight:  Good   Engagement in Group:  Limited  Engagement in Therapy:  Limited  Modes of Intervention:  Clarification, Socialization and Support  Summary of Progress/Problems:  The focus of this group session was to process how we deal with difficult emotions and share with others the patterns that play out when we are reacting to the emotion verses the situation.  [name]  shared "difficult emotions usually prompt my cutting, burning, alcohol use, and abuse directed toward family, espeically husband."  Patient was very drowsy and slept a good portion of group.     Clide Dales 09/08/2011, 11:08 AM

## 2011-09-08 NOTE — Progress Notes (Signed)
Psychoeducational Group Note  Date:  09/08/2011 Time:  2000 Group Topic/Focus:  Karaoke group  Participation Level:  Active  Participation Quality:  Appropriate  Affect:  Appropriate  Cognitive:  Appropriate  Insight:  Good  Engagement in Group:  Good  Additional Comments:  Pt. sang during [employer] 09/08/2011, 10:23 PM

## 2011-09-08 NOTE — Progress Notes (Signed)
[facility] In Patient Progress Note  09/08/2011 9:38 AM [name] 03-12-1975 [id number]  Diagnosis:  Axis I: BPAD; Alcohol Use Disorder; r/o PD NOS with Cluster B Traits   ADL's:  Intact  Sleep:  Yes,  AEB:  Appetite: too good  Suicidal Ideation: No suicidal ideation, no plan, no intent, no means.  Homicidal Ideation:  No homicidal ideation, no plan, no intent, no means.  Subjective: [name] states she is frustrated as she thought she would be going home today and she is now not leaving.  She is saddened that she will miss her 20 yr old son's start tonight in JV football.   height is 5\' 8"  (1.727 m) and weight is 95.255 kg (210 lb). Her oral temperature is 98 F (36.7 C). Her blood pressure is 115/84 and her pulse is 96. Her respiration is 18.   Objective: The patient states that she understands the reason for her to stay but is not happy about it. She is frustrated, but clear and goal oriented. She admits that she has done these groups before and that she is well established with a group [facility] but had stopped going prior to her admission.   Mental Status: Level of Consciousness:  Alert Orientation: x 3 General Appearance : casual  Behavior:  cooperative Eye Contact:  Good Motor Behavior:  Normal Speech:  Normal Mood:  Depressed Affect:  Tearful Anxiety Level:  Minimal Thought Process:  Relevant Thought Content:  WNL Perception:  Normal Judgment:  Poor Insight:  Absent Cognition:  At leaset Sleep:  Number of Hours: 6.75   Lab Results: No results found for this or any previous visit (from the past 48 hour(s)). Labs are reviewed. Physical Findings: AIMS: Facial and Oral Movements Muscles of Facial Expression: None, normal Lips and Perioral Area: None, normal Jaw: None, normal Tongue: None, normal,Extremity Movements Upper (arms, wrists, hands, fingers): None, normal Lower (legs, knees, ankles, toes): None, normal, Trunk Movements Neck, shoulders, hips: None, normal,  Overall Severity Severity of abnormal movements (highest score from questions above): None, normal Incapacitation due to abnormal movements: None, normal Patient's awareness of abnormal movements (rate only patient's report): No Awareness, Dental Status Current problems with teeth and/or dentures?: Yes Does patient usually wear dentures?: Yes  CIWA:  CIWA-Ar Total: 0  COWS:    Medication:  . divalproex  500 mg Oral Daily  . gabapentin  300 mg Oral BID  . gabapentin  600 mg Oral QHS  . multivitamin with minerals  1 tablet Oral Daily  . nicotine  21 mg Transdermal Q0600  . risperiDONE  1 mg Oral BID  . thiamine  100 mg Intramuscular Once  . thiamine  100 mg Oral Daily  . venlafaxine XR  150 mg Oral Q breakfast    Treatment Plan Summary: Pt admitted for crisis stabilization and treatment. PT requested start of Depakote over restart of Lamotrigine and increase in Risperdal for acute agitation. Views Effexor and Neurontin as helpful. Please see orders. Medications reviewed with pt and medication education provided. Will continue q15 minute checks per unit protocol. No clinical indication for one on one level of observation at this time. Pt contracting for safety. Mental health treatment, medication management and continued sobriety will mitigate against the increased risk of harm to self and/or others. Discussed the importance of recovery with pt, as well as, tools to move forward in a healthy & safe manner. Pt agreeable with the plan. Discussed with the team.   Plan: 1. Will order  depakote level for in the morning to establish level. 2. Continue current medications as ordered.  Rona Ravens. Bianna Haran PAC 09/08/2011, 9:38 AM

## 2011-09-08 NOTE — Progress Notes (Signed)
D:  Patient up and active in the milieu today.  Became tearful late this morning stating she wanted to go home because she would be missing her sons football game tonight.  States she feels better and does not know why she is still here.   A:  Educated patient on the need to monitor while she is starting on medications, especially with the thoughts she was having prior to admission.   R:  Patient receptive, but remains intermittently tearful.

## 2011-09-09 DIAGNOSIS — F1010 Alcohol abuse, uncomplicated: Secondary | ICD-10-CM

## 2011-09-09 DIAGNOSIS — F3130 Bipolar disorder, current episode depressed, mild or moderate severity, unspecified: Principal | ICD-10-CM

## 2011-09-09 LAB — VALPROIC ACID LEVEL: Valproic Acid Lvl: 16.7 ug/mL — ABNORMAL LOW (ref 50.0–100.0)

## 2011-09-09 MED ORDER — DIVALPROEX SODIUM ER 500 MG PO TB24: 500.0000 mg | ORAL_TABLET | Freq: Every day | ORAL | Status: DC

## 2011-09-09 MED ORDER — GABAPENTIN 600 MG PO TABS: ORAL_TABLET | ORAL | Status: DC

## 2011-09-09 MED ORDER — LAMOTRIGINE 100 MG PO TABS: 100.0000 mg | ORAL_TABLET | Freq: Every day | ORAL | Status: DC

## 2011-09-09 MED ORDER — TRAZODONE HCL 100 MG PO TABS: 100.0000 mg | ORAL_TABLET | Freq: Every evening | ORAL | Status: DC | PRN

## 2011-09-09 MED ORDER — RISPERIDONE 1 MG PO TABS: 1.0000 mg | ORAL_TABLET | Freq: Two times a day (BID) | ORAL | Status: DC

## 2011-09-09 MED ORDER — VENLAFAXINE HCL ER 150 MG PO CP24: 150.0000 mg | ORAL_CAPSULE | Freq: Every day | ORAL | Status: DC

## 2011-09-09 MED ORDER — GABAPENTIN 600 MG PO TABS
300.0000 mg | ORAL_TABLET | Freq: Two times a day (BID) | ORAL | Status: DC
  Filled 2011-09-09 (×2): qty 28

## 2011-09-09 NOTE — Progress Notes (Signed)
Pt discharged per MD orders; pt currently denies SI/HI and auditory/visual hallucinations; pt was given education by RN regarding follow-up appointments and medications and pt denied any questions or concerns about these instructions; pt escorted RN before being discharged to hospital lobby.

## 2011-09-09 NOTE — Progress Notes (Signed)
Pt participated in therapeutic activity where they watched the pursuit of happiness. They will finish the movie later in the afternoon and have time to process what they watched.   [employer] MHT

## 2011-09-09 NOTE — Tx Team (Signed)
Interdisciplinary Treatment Plan Update (Adult)  Date:  09/09/2011  Time Reviewed:  11:39 AM   Progress in Treatment: Attending groups: Yes Participating in groups:  Yes Taking medication as prescribed: Yes Tolerating medication:  Yes Family/Significant other contact made:  Yes Patient understands diagnosis:  Yes Discussing patient identified problems/goals with staff:  Yes Medical problems stabilized or resolved:  Yes Denies suicidal/homicidal ideation: Yes Issues/concerns per patient self-inventory:  None identified Other: N/A  New problem(s) identified: None Identified  Reason for Continuation of Hospitalization: Stable to d/c  Interventions implemented related to continuation of hospitalization: Stable to d/c  Additional comments: N/A  Estimated length of stay: D/C today  Discharge Plan: Pt is scheduled to follow up at [facility] in [place] for medication management and therapy.    New goal(s): N/A  Review of initial/current patient goals per problem list:    1. Goal(s): Eliminate SI  Met: Yes  Target date:9/3  As evidenced by:pt denies SI  2. Goal (s): Stabilize mood  Met: Yes Target date:9/6  As evidenced [id number] will rate her depression and anxiety at 4 or less.  Pt rates depression and anxiety at a 1 today.    3. Goal(s):Identify comprehensive sobriety plan  Met: Yes Target date:9/6  As evidenced by: pt has a d/c plan in place.    Attendees: Patient:  [name] 09/09/2011 2:22 PM   Family:     Physician:  Orson Aloe, MD  09/09/2011  11:39 AM   Nursing:   Berneice Heinrich, RN 09/09/2011 11:41 AM   Case Manager:  Reyes Ivan, LCSWA 09/09/2011  11:39 AM   Counselor:  Angus Palms, LCSW 09/09/2011  11:39 AM   Other:  Juline Patch, LCSW 09/09/2011  11:39 AM   Other:  Nestor Ramp, RN 09/09/2011 11:41 AM   Other:     Other:      Scribe for Treatment Team:   Carmina Miller, 09/09/2011 , 11:39 AM

## 2011-09-09 NOTE — Progress Notes (Signed)
[facility] Case Management Discharge Plan:  Will you be returning to the same living situation after discharge: Yes,  returning home At discharge, do you have transportation home?:Yes,  husband will pick pt up Do you have the ability to pay for your medications:Yes,  access to meds  Interagency Information:     Release of information consent forms completed and in the chart;  Patient's signature needed at discharge.  Patient to Follow up at:  Follow-up Information    Follow up with [facility]. (Attend group sessions beginning Tuesday at 2:30pm)    Contact information:   [address] [place], [place] [address] [phone]      Follow up with [facility] on 10/11/2011. (Doctor appt. at 1:30pm)    Contact information:   [address]  [place], [place] [address] [phone]      Follow up with [facility] on 09/14/2011. (Appt. with Dr. Raliegh Scarlet at 11am)    Contact information:   [address] [place], [place] [address] [phone]         Patient denies SI/HI:   Yes,  denies SI/HI    Safety Planning and Suicide Prevention discussed:  Yes,  discussed with pt  Barrier to discharge identified:No.  Summary and Recommendations: Pt attended discharge planning group and actively participated in group.  SW provided pt with today's workbook.  Pt presents with calm mood and affect.  Pt rates depression and anxiety at a 1 today.  Pt denies SI/HI.  Pt reports feeling stable to d/c today.  No recommendations from SW.  No further needs voiced by pt.  Pt stable to discharge.     [name] 09/09/2011, 2:23 PM
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
## 2011-09-09 NOTE — Progress Notes (Signed)
Patient's husband Rim Thatch requested information on how best to help patient's family of origin understand what patient's bipolar experience is and how best to be supportive of patient.  Writer provided title and Chartered loss adjuster of bipolar workbook which Jonny Ruiz was willing to order online. 09/09/2011 Clide Dales 12:37 PM

## 2011-09-09 NOTE — Progress Notes (Signed)
D: Patient denies SI and HI. The patient has a sad mood and affect. The patient rates her depression and hopelessness both a 2 out of 10 (1 low/10 high). The patient reports sleeping well and states that her appetite and energy level are both good. The patient is attending groups and is appropriately participating within the milieu.  A: Patient given emotional support from RN. Patient encouraged to come to staff with concerns and/or questions. Patient's medication routine continued. Patient's orders and plan of care reviewed.  R: Patient remains appropriate and cooperative. Will continue to monitor patient q15 minutes for safety.

## 2011-09-09 NOTE — Progress Notes (Signed)
[facility] Group Notes: (Counselor/Nursing/MHT/Case Management/Adjunct) 09/08/2011  @1 :15pm [facility]  Type of Therapy:  Group Therapy  Participation Level:  Good  Participation Quality:  Good  Affect:  Appropriate  Cognitive:  Appropriate  Insight:  Good  Engagement in Group:  Good  Engagement in Therapy:  Good  Modes of Intervention:  Support and Exploration  Summary of Progress/Problems: [name] participated with speaker from [employer] [facility] [place] and expressed interest in programs [facility] offers. She seemed to relate well to stories and experiences shared by speaker and other group members, though she did not share her own story.   [name] 09/09/2011  8:22 AM

## 2011-09-09 NOTE — Progress Notes (Signed)
Psychoeducational Group Note  Date:  09/09/2011 Time:  11:40  Group Topic/Focus:  Relapse Prevention Planning:   The focus of this group is to define relapse and discuss the need for planning to combat relapse.  Participation Level:  Minimal  Participation Quality:  Appropriate  Affect:  Flat  Cognitive:  Appropriate  Insight:  Good  Engagement in Group:  Limited  Additional Comments:  Came to group but did not participate or answer any questions  Lauralee Evener 09/09/2011, 11:40 AM

## 2011-09-09 NOTE — Progress Notes (Signed)
Read and reviewed. 

## 2011-09-09 NOTE — Progress Notes (Signed)
[facility] Group Notes: (Counselor/Nursing/MHT/Case Management/Adjunct) 09/09/2011   @1 :15pm - 2:30pm Empowerment in Relapse Prevention  Type of Therapy:  Group Therapy  Participation Level:  Active  Participation Quality:  Appropriate, Sharing, Supportive    Affect:  Appropriate  Cognitive:  Appropriate  Insight:  Limited  Engagement in Group: Good  Engagement in Therapy:  Good  Modes of Intervention:  Support and Exploration  Summary of Progress/Problems: [name] was very engaged in group. She explored her ideas of power and control, stating that she has always let others be the ones in charge of making decisions for her because it was too hard and she did not trust herself to make the right decisions. [name] processed how this has affected her and added that she has seen "normal" women who are independent and admires this in them. She shared how she now wants to take more control over her life, and that she plans to do so by starting with making small choices. During the discussion [name] had the realization that by allowing others to make the decisions, she was also handing over the them her chance at happiness, and was able to identify that she wants to take responsibility for her own wellbeing.  [name] 09/09/2011 2:52 PM

## 2011-09-09 NOTE — Progress Notes (Signed)
[facility] Adult Inpatient Family/Significant Other Suicide Prevention Education  Suicide Prevention Education:  Education Completed; Patient's husband Kalena Mander at [phone] has been identified by the patient as the family member/significant other with whom the patient will be residing, and identified as the person(s) who will aid the patient in the event of a mental health crisis (suicidal ideations/suicide attempt).  With written consent from the patient, the family member/significant other has been provided the following suicide prevention education, prior to the and/or following the discharge of the patient.  The suicide prevention education provided includes the following:  Suicide risk factors  Suicide prevention and interventions  National Suicide Hotline telephone number  [facility] assessment telephone number  [facility] Emergency Assistance 911  [facility] and/or Residential Mobile Crisis Unit telephone number  Request made of family/significant other to:  Remove weapons (e.g., guns, rifles, knives), all items previously/currently identified as safety concern.    Remove drugs/medications (over-the-counter, prescriptions, illicit drugs), all items previously/currently identified as a safety concern.  Husband states there are no firearms nor alcohol in the home  The family member/significant other verbalizes understanding of the suicide prevention education information provided.  The family member/significant other agrees to remove the items of safety concern listed above.  Clide Dales 09/09/2011, 12:04 PM

## 2011-09-12 NOTE — Progress Notes (Signed)
Patient Discharge Instructions:  After Visit Summary (AVS):   Faxed to:  09/12/2011 Psychiatric Admission Assessment Note:   Faxed to:  09/12/2011 Suicide Risk Assessment - Discharge Assessment:   Faxed to:  09/12/2011 Faxed/Sent to the Next Level Care provider:  09/12/2011  Faxed to [facility] @ [phone] And to [facility] @ [phone]  Wandra Scot, 09/12/2011, 2:00 PM

## 2012-02-27 ENCOUNTER — Emergency Department: Payer: Self-pay | Admitting: Emergency Medicine

## 2012-05-12 ENCOUNTER — Emergency Department: Payer: Self-pay | Admitting: Emergency Medicine

## 2012-05-12 LAB — CBC
HCT: 38.4 % (ref 35.0–47.0)
HGB: 13.5 g/dL (ref 12.0–16.0)
MCHC: 35.1 g/dL (ref 32.0–36.0)
MCV: 90 fL (ref 80–100)
Platelet: 239 10*3/uL (ref 150–440)
RBC: 4.28 10*6/uL (ref 3.80–5.20)

## 2012-05-12 LAB — COMPREHENSIVE METABOLIC PANEL
Albumin: 3.5 g/dL (ref 3.4–5.0)
Alkaline Phosphatase: 75 U/L (ref 50–136)
Anion Gap: 4 — ABNORMAL LOW (ref 7–16)
Calcium, Total: 8.9 mg/dL (ref 8.5–10.1)
Chloride: 105 mmol/L (ref 98–107)
Creatinine: 0.81 mg/dL (ref 0.60–1.30)
EGFR (Non-African Amer.): >60
Glucose: 71 mg/dL (ref 65–99)
Potassium: 4 mmol/L (ref 3.5–5.1)
SGOT(AST): 18 U/L (ref 15–37)
Sodium: 137 mmol/L (ref 136–145)
Total Protein: 7.6 g/dL (ref 6.4–8.2)

## 2012-05-12 LAB — DRUG SCREEN, URINE
Amphetamines, Ur Screen: NEGATIVE (ref ?–1000)
Benzodiazepine, Ur Scrn: NEGATIVE (ref ?–200)
Cannabinoid 50 Ng, Ur ~~LOC~~: NEGATIVE (ref ?–50)
Cocaine Metabolite,Ur ~~LOC~~: NEGATIVE (ref ?–300)
Methadone, Ur Screen: NEGATIVE (ref ?–300)
Opiate, Ur Screen: NEGATIVE (ref ?–300)
Phencyclidine (PCP) Ur S: NEGATIVE (ref ?–25)
Tricyclic, Ur Screen: NEGATIVE (ref ?–1000)

## 2012-05-12 LAB — URINALYSIS, COMPLETE
Bacteria: NONE SEEN
Glucose,UR: NEGATIVE mg/dL (ref 0–75)
Ketone: NEGATIVE
Nitrite: NEGATIVE
Ph: 7 (ref 4.5–8.0)
Protein: NEGATIVE
RBC,UR: 8 /HPF (ref 0–5)
Squamous Epithelial: 5

## 2013-03-13 LAB — URINALYSIS, COMPLETE
BILIRUBIN, UR: NEGATIVE
Glucose,UR: NEGATIVE mg/dL (ref 0–75)
Ketone: NEGATIVE
Leukocyte Esterase: NEGATIVE
Nitrite: NEGATIVE
Ph: 6 (ref 4.5–8.0)
Protein: NEGATIVE
RBC,UR: 6 /HPF (ref 0–5)
Specific Gravity: 1.013 (ref 1.003–1.030)
Squamous Epithelial: 7
WBC UR: 3 /HPF (ref 0–5)

## 2013-03-13 LAB — CBC
HCT: 41.7 % (ref 35.0–47.0)
HGB: 13.7 g/dL (ref 12.0–16.0)
MCH: 30 pg (ref 26.0–34.0)
MCHC: 32.9 g/dL (ref 32.0–36.0)
MCV: 91 fL (ref 80–100)
Platelet: 304 10*3/uL (ref 150–440)
RBC: 4.58 10*6/uL (ref 3.80–5.20)
RDW: 14.8 % — ABNORMAL HIGH (ref 11.5–14.5)
WBC: 13.7 10*3/uL — AB (ref 3.6–11.0)

## 2013-03-13 LAB — ACETAMINOPHEN LEVEL: Acetaminophen: <2 ug/mL

## 2013-03-13 LAB — DRUG SCREEN, URINE
Amphetamines, Ur Screen: NEGATIVE (ref ?–1000)
BARBITURATES, UR SCREEN: NEGATIVE (ref ?–200)
Benzodiazepine, Ur Scrn: NEGATIVE (ref ?–200)
COCAINE METABOLITE, UR ~~LOC~~: NEGATIVE (ref ?–300)
Cannabinoid 50 Ng, Ur ~~LOC~~: NEGATIVE (ref ?–50)
MDMA (ECSTASY) UR SCREEN: POSITIVE (ref ?–500)
Methadone, Ur Screen: NEGATIVE (ref ?–300)
OPIATE, UR SCREEN: NEGATIVE (ref ?–300)
Phencyclidine (PCP) Ur S: NEGATIVE (ref ?–25)
Tricyclic, Ur Screen: NEGATIVE (ref ?–1000)

## 2013-03-13 LAB — SALICYLATE LEVEL: SALICYLATES, SERUM: 3.5 mg/dL — AB

## 2013-03-13 LAB — COMPREHENSIVE METABOLIC PANEL
ALBUMIN: 3.5 g/dL (ref 3.4–5.0)
Alkaline Phosphatase: 95 U/L
Anion Gap: 2 — ABNORMAL LOW (ref 7–16)
BUN: 7 mg/dL (ref 7–18)
Bilirubin,Total: 0.3 mg/dL (ref 0.2–1.0)
CO2: 29 mmol/L (ref 21–32)
Calcium, Total: 8.6 mg/dL (ref 8.5–10.1)
Chloride: 105 mmol/L (ref 98–107)
Creatinine: 0.84 mg/dL (ref 0.60–1.30)
EGFR (African American): >60
EGFR (Non-African Amer.): >60
GLUCOSE: 74 mg/dL (ref 65–99)
OSMOLALITY: 269 (ref 275–301)
Potassium: 3.9 mmol/L (ref 3.5–5.1)
SGOT(AST): 25 U/L (ref 15–37)
SGPT (ALT): 25 U/L (ref 12–78)
SODIUM: 136 mmol/L (ref 136–145)
Total Protein: 7.9 g/dL (ref 6.4–8.2)

## 2013-03-13 LAB — ETHANOL: Ethanol: <3 mg/dL

## 2013-03-13 LAB — PREGNANCY, URINE: Pregnancy Test, Urine: NEGATIVE m[IU]/mL

## 2013-03-14 ENCOUNTER — Inpatient Hospital Stay: Payer: Self-pay | Admitting: Psychiatry

## 2013-03-17 LAB — LIPID PANEL
CHOLESTEROL: 298 mg/dL — AB (ref 0–200)
HDL: 39 mg/dL — AB (ref 40–60)
LDL CHOLESTEROL, CALC: 213 mg/dL — AB (ref 0–100)
Triglycerides: 229 mg/dL — ABNORMAL HIGH (ref 0–200)
VLDL CHOLESTEROL, CALC: 46 mg/dL — AB (ref 5–40)

## 2013-03-19 LAB — LITHIUM LEVEL: Lithium: 0.38 mmol/L — ABNORMAL LOW

## 2013-03-22 LAB — LITHIUM LEVEL: LITHIUM: 0.79 mmol/L

## 2013-06-21 ENCOUNTER — Inpatient Hospital Stay: Payer: Self-pay | Admitting: Psychiatry

## 2013-06-21 LAB — URINALYSIS, COMPLETE
Bacteria: NONE SEEN
Bilirubin,UR: NEGATIVE
GLUCOSE, UR: NEGATIVE mg/dL (ref 0–75)
Hyaline Cast: 14
Leukocyte Esterase: NEGATIVE
NITRITE: NEGATIVE
Ph: 5 (ref 4.5–8.0)
Protein: 30
RBC,UR: 14 /HPF (ref 0–5)
Specific Gravity: 1.027 (ref 1.003–1.030)
Squamous Epithelial: 6

## 2013-06-21 LAB — COMPREHENSIVE METABOLIC PANEL
ALK PHOS: 84 U/L
ALT: 27 U/L (ref 12–78)
Albumin: 3.9 g/dL (ref 3.4–5.0)
Anion Gap: 6 — ABNORMAL LOW (ref 7–16)
BUN: 11 mg/dL (ref 7–18)
Bilirubin,Total: 0.4 mg/dL (ref 0.2–1.0)
CALCIUM: 9.3 mg/dL (ref 8.5–10.1)
CO2: 26 mmol/L (ref 21–32)
CREATININE: 0.85 mg/dL (ref 0.60–1.30)
Chloride: 104 mmol/L (ref 98–107)
EGFR (Non-African Amer.): >60
Glucose: 106 mg/dL — ABNORMAL HIGH (ref 65–99)
OSMOLALITY: 272 (ref 275–301)
Potassium: 3.3 mmol/L — ABNORMAL LOW (ref 3.5–5.1)
SGOT(AST): 13 U/L — ABNORMAL LOW (ref 15–37)
SODIUM: 136 mmol/L (ref 136–145)
Total Protein: 8.1 g/dL (ref 6.4–8.2)

## 2013-06-21 LAB — ACETAMINOPHEN LEVEL

## 2013-06-21 LAB — CBC
HCT: 44.4 % (ref 35.0–47.0)
HGB: 14.6 g/dL (ref 12.0–16.0)
MCH: 31.1 pg (ref 26.0–34.0)
MCHC: 32.9 g/dL (ref 32.0–36.0)
MCV: 95 fL (ref 80–100)
Platelet: 291 10*3/uL (ref 150–440)
RBC: 4.69 10*6/uL (ref 3.80–5.20)
RDW: 13.5 % (ref 11.5–14.5)
WBC: 13.2 10*3/uL — ABNORMAL HIGH (ref 3.6–11.0)

## 2013-06-21 LAB — ETHANOL

## 2013-06-21 LAB — SALICYLATE LEVEL: SALICYLATES, SERUM: 5.3 mg/dL — AB

## 2013-09-11 ENCOUNTER — Emergency Department: Payer: Self-pay | Admitting: Emergency Medicine

## 2014-03-27 ENCOUNTER — Emergency Department: Payer: Self-pay | Admitting: Emergency Medicine

## 2014-04-26 NOTE — Consult Note (Signed)
Psychological Assessment  Shelia CurryMargaret Drake39of Evaluation: [birth year]Administered: [place] Multiphasic Personality Inventory-2 (MMPI-2) for Referral: Ms. Shelia BrilliantDrake was referred for a psychological assessment by her physician, Shelia LineaJolanta Pucilowska, MD.  She was admitted to Behavioral Medicine for the treatment of suicidal ideation. She has been diagnosed with bipolar disorder and alcohol use in the past.  Please see the history and physical and psychosocial history for further background information. An assessment of personality structure was requested. [name]?s MMPI-2 protocol is compared to that of other adult females she obtained the following profile: 4826*7"90?+-13/5:#. The MMPI-2 validity scales indicate that the clinical profile is valid but some exaggeration of symptoms is likely probably in an attempt to draw attention to the fear, pain and distress she is experiencing. PresentationShe reports that she is experiencing a moderate to severe level of emotional distress characterized by dysphoria, agitation and anhedonia. She finds it very difficult to get things done in her life and has little hope of being successful if she could get motivated at something. She frequently worries about something or someone. She is more sensitive and feels more intensely than most people. Her feelings are easily hurt and she is inclined to take things hard. She easily becomes impatient with people and often has serious disagreements with people who are close to her. She often feels resentful, angry, irritable, and grouchy. She has difficulty controlling or expressing her anger appropriately. In response to stress, she is likely either to withdraw completely or to act out her angry impulses. It makes her angry when people give her advice or hurry her. She also gets angry with herself for giving in to others so much. She has become so angry that she does not know what comes over her and she feels as though she will explode. At  times she has a strong urge to do something harmful or shocking.  She reports that she has problems with attention and concentration. She has a hard time making decisions. She lacks self-confidence and believes that she is not as good as other people and thinks she is useless at times. She exhibits poor judgment and is often unpredictable and impulsive. Her judgment is not as good as it once was. She feels insecure, isolated, rejected, and unwanted. She is threatened by a world that she views as hostile and dangerous. She has had very peculiar and strange experiences and thoughts and often feels as if things are not real. She is sure that she is being talked about. She believes that people say insulting and vulgar things about her and that someone has it in for her. She thinks and dreams of things that are too bad to talk about. She thinks that there is something wrong with her mind and that she is about to go to pieces. She is very sensitive to the motives of others and hypervigilant. She has often thought that strangers were looking at her critically. She thinks that most people will use unfair means and stretch the truth to get ahead. She often wonders what hidden reason another person may have for being nice to her. She believes that it is safer to trust nobody. She is sure that she gets a raw deal from life and in everything she does lately, she thinks that she is being tested. Much of the time she thinks she has done something wrong or evil. She has not lived the right kind of life, made lots of bad mistakes in her life, and done many things that she later regrets.  She has often been misunderstood when she was trying to be helpful and her way of doing things is apt to be misunderstood by others. She knows she is a burden to others.  Relations: She reports that she is introverted and has difficulty with close, emotional relationship. She may lack basic social skills and tends to be socially withdrawn and  isolated. Even when she is with people, she feels lonely much of the time. She is emotionally distant, feels lonely, and knows that no one understands her and her problems. Her tendency to feel rejected by others often leads to hostility and conflict, which only exacerbates her feelings of being alienated from others.  Problem Areas: She reports few physical symptoms. She does not wake up fresh and rested most mornings. She is very likely to abuse substances which exacerbate all of her problems. Her problems may involve inappropriate sexual behavior. She has been in trouble with the law. She is likely to have a history of suicidal behavior so suicide ideation should be evaluated carefully. Her isolation and proneness to act out impulsively toward herself or others increase the potential for suicide. Her prognosis is generally very poor because of the characterologic nature of her problems. Psychopharmacologic interventions, other than possibly antidepressants, also are unlikely to be very effective because of the characterologic problems involved. Interventions focused on specific behavioral objectives may be useful. Social skill and/or assertiveness training in a group setting may be very beneficial. difficulties in forming an emotional relationship and her reluctance to self-disclose make the establishment of a therapeutic alliance problematic at best. There are a number of specific issues that must be kept in mind when establishing and maintaining the therapeutic alliance: no one seems to understand he, she has difficulty starting to do things, she believes it is safer to trust nobody, she gives up quickly when things go wrong or because she thinks too little of her ability, she has done some bad things in the past that she will never tell anyone about, it makes her nervous when people ask her personal questions, she feels unable to tell anyone all about herself, she is hard to get to know and it is hard for  her to accept compliments. Impression:Bipolar DisorderPersonality Disorder   Electronic Signatures: Carola Frost (PsyD, HSP-P)  (Signed on 18-Mar-15 12:56)  Authored  Last Updated: 18-Mar-15 12:56 by Carola Frost (PsyD, HSP-P)

## 2014-04-26 NOTE — H&P (Signed)
PATIENT NAME:  [name], [name]:  [id number] DATE OF BIRTH:  12/20/1975  DATE OF ADMISSION:  03/14/2013 DATE OF ASSESSMENT: 03/15/2013   REFERRING PHYSICIAN: Emergency Room M.D.   ATTENDING PHYSICIAN: Kristine LineaJolanta Nadim Malia, M.D.   IDENTIFYING DATA: Ms. [name] is a 39 year old female with history of bipolar disorder and alcoholism.    CHIEF COMPLAINT: "Nobody cares."   HISTORY OF PRESENT ILLNESS: Ms. [name] has a long history of depression and possibly bipolar illness. She has been a patient of Dr. Gaylene BrooksMoffett's for the past two years. She has been compliant with medications of Wellbutrin and Effexor. In spite of treatment, she still feels depressed and very anxious. She is unable to leave the house or be with people. In order to get to work, she has to have a couple of beers in the morning. She became paranoid, thinking that people are talking about her. She started cutting again. She has been buying razors and did some scratching on her left arm two days ago.   On the day of admission, she was very adamant to cut and the husband had to take her to her doctor. She has profound symptoms of depression with poor sleep, decreased appetite, anhedonia, feelings of guilt, worthlessness, hopelessness, inadequacy, poor memory and concentration, very low energy. She is unable to accomplish any task at home. Does not take care of her family or clean the house. She isolates herself. She developed suicidal ideation with a plan to cut. She has possibly some psychotic symptoms. The patient admits that she feels paranoid. The husband reports that she has unusual beliefs. Her father, who passed away five years ago, and the patient misses him very much. She reportedly believes that [place] [employer] [place] [place]. Accuses her husband of unfaithfulness. She believes that she is a bad mother for her children because she buys them fast food. She has a sense of total failure.   She is drinking daily, maybe 40-ounce beers.  She requires alcohol detox. She denies using other drugs or prescription pills.   PAST PSYCHIATRIC HISTORY: A long history of depression. She was [facility] at [facility] in February 2013. She was also [facility]. She had several suicide attempts or gesture by cutting. She has been treated mostly with antidepressants but reports a try of lithium and Depakote but does not remember whether they were helpful or not.   FAMILY PSYCHIATRIC HISTORY: None reported.   PAST MEDICAL HISTORY: None.   ALLERGIES: No known drug allergies.   MEDICATIONS ON ADMISSION: Effexor-XR 300 mg daily, Wellbutrin XL 150 mg daily.   SOCIAL HISTORY: She started a new job in January, but it is difficult to handle because it is boring. She has three children, ages 5210, 212, and 7115. She is married. She was let go of her previous job because she says she behaved poorly but would not elaborate. Her husband has already been calling the hospital trying to relate some information. He is mostly concerned about her drinking and her depression, believes that she is severely depressed and drinking way too much. He does not know how much but knows of her early morning drinking and knows that she drinks because he finds empty liquor bottles around the house. 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   REVIEW OF SYSTEMS:  CONSTITUTIONAL: No fevers or chills. Positive for gradual weight  gain.  EYES: No double or blurred vision.  ENT: No hearing loss.  RESPIRATORY: No shortness of breath or cough.  CARDIOVASCULAR: No chest pain or orthopnea.  GASTROINTESTINAL: No abdominal pain, nausea, vomiting, or diarrhea.  GENITOURINARY: No incontinence or frequency.  ENDOCRINE: No heat or cold intolerance.  LYMPHATIC: No anemia or easy bruising.  INTEGUMENTARY: No acne or rash.  MUSCULOSKELETAL: No muscle  or joint pain.  NEUROLOGIC: No tingling or weakness.  PSYCHIATRIC: See history of present illness for details.   PHYSICAL EXAMINATION:  VITAL SIGNS: Blood pressure 127/83, pulse 102, respirations 18, temperature 98.8.  GENERAL: An obese female in no acute distress.  HEENT: The pupils are equal, round, and reactive to light. Sclerae are anicteric.  NECK: Supple. No thyromegaly.  LUNGS: Clear to auscultation. No dullness to percussion.  HEART: Regular rhythm and rate. No murmurs, rubs, or gallops.  ABDOMEN: Soft, nontender, nondistended. Positive bowel sounds.  MUSCULOSKELETAL: Normal muscle strength in all extremities.  SKIN: No rashes or bruises.  LYMPHATIC: No cervical adenopathy.  NEUROLOGIC: Cranial nerves II through XII are intact.   LABORATORY DATA: Chemistries are within normal limits. Blood alcohol level is zero. LFTs within normal limits. 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 CBC within normal limits except for white blood count of 13.7. Urinalysis is not suggestive of urinary tract infection. Serum acetaminophen is 2. Serum salicylates 3.5. Urine pregnancy test is negative.   MENTAL STATUS EXAMINATION ON ADMISSION: The patient is asleep but easily arousable. She is oriented to person, place, time and situation. She is pleasant, polite and cooperative. She is adequately groomed and casually dressed. She maintains limited eye contact. Her speech is soft and slow. Mood is depressed with anxious affect. Thought process is logical and goal oriented. Thought content: She denies suicidal or homicidal ideation now but was admitted for suicidal ideation and some cutting. She is paranoid and delusional. She denies auditory or visual hallucinations. Her cognition is grossly intact. She registers three out of three and recalls two out of three objects after five minutes. She can spell "world" forward and backward. She knows the current president. Her insight and judgment are questionable. She is of  normal intelligence and appropriate fund of knowledge.   SUICIDE RISK ASSESSMENT: This is a patient with a history of depression, mood instability,  self-injurious behavior, alcoholism, who came to the hospital complaining of worsening of depression and suicidal ideation after cutting. She also has some psychotic symptoms present. She is at increased risk of suicide.   DIAGNOSES:  AXIS I: Bipolar affective disorder, depressed, severe, with psychosis; alcohol dependence.  AXIS II: History of diagnosis of borderline personality disorder.  AXIS III: Obesity.  AXIS IV: Mental illness, substance abuse, marital conflict, occupational.  AXIS V: Global assessment of functioning, 25.   PLAN: The patient was admitted to [facility] behavioral medicine unit for safety, stabilization and medication management. She was initially placed on suicide precautions and was closely monitored for any unsafe behaviors. She underwent full psychiatric and risk assessment. She received pharmacotherapy, individual and group psychotherapy, substance abuse counseling, and support from therapeutic milieu.  1.  Suicidal ideation: The patient is able to contract for safety.  2.  Mood: Dr. Garnetta Buddy, admitting psychiatrist, started the patient on low-dose lithium. She continued Effexor and discontinued Wellbutrin.  3.  Alcohol dependence: She is on CIWA protocol.  4.  Psychotic symptoms. She may require an antipsychotic, although her paranoia may reflect extreme anxiety.  5.  Substance abuse  treatment: The patient is not interested. She feels that she needs to return to her family and to work immediately.   DISPOSITION: She will be discharged to home. Follow up with Dr. Marguerite Olea.    ____________________________ Ellin Goodie. Jennet Maduro, MD jbp:[id number] D: 03/15/2013 16:54:05 ET T: 03/15/2013 18:08:32 ET JOB#: [id number]  cc: Johanna Matto B. Jennet Maduro, MD, <Dictator> Shari Prows MD ELECTRONICALLY SIGNED  03/16/2013 15:23

## 2014-04-26 NOTE — Consult Note (Signed)
PATIENT NAME:  [name], [name] MR#:  [id number] DATE OF BIRTH:  [birth year]  DATE OF CONSULTATION:  03/14/2013  REFERRING PHYSICIAN:  Maurilio LovelyNoelle McLaurin, MD CONSULTING PHYSICIAN:  Ardeen FillersUzma S. Garnetta BuddyFaheem, MD  REASON FOR CONSULTATION: "I'd rather be at peace."  HISTORY OF PRESENT ILLNESS: The patient is a 39 year old married Caucasian female who presented to the ED by the police from the [facility] under involuntary petition. She was having suicidal ideations with a plan to cut herself and to bleed out to death. She reported that she has been having issues and has been feeling very depressed. During my interview she reported that she has been tired of her life and she was going to cut herself yesterday. She reported that she has been feeling depressed for the past 1 week as he has started binge drinking more for the past week. She has been consuming 2 beers of alcohol, approximately 24 ounces, on a daily basis. She consumes as well at night.  She started feeling worthless, hopeless, and helpless. She was unable to control her brain. She was making everybody mad and her husband was aggravated. She started buying box cutters to cut herself. She wanted her life to stop. She reported that her husband confiscated the pack of the box cutters. She reported as he was driving her to [facility] she asked him to stop the car and went into the [employer] and bought another box. Reported that she cannot control her impulses and her rages and was drinking herself to death. She stated that she has a history of suicidal attempts in the past before. The patient was unable to contract for safety and was brought here for stabilization and for admission to a psychiatric hospital.   PAST PSYCHIATRIC HISTORY: The patient reported that she was hospitalized at [facility] last year and was started on medications. Currently she [facility] and has been taking her medications. She feels that the medications are not helping her. She has  history of suicide attempts by overdose on pills in the past. She is currently on the combination of the Effexor, Wellbutrin and Neurontin. Reported that she has been tried on Depakote and lithium in the past, but she does not remember how the medications were effective. She is willing to have her medications adjusted at this time.   SUBSTANCE ABUSE HISTORY: The patient reported that she has been drinking alcohol off and on for many years. She has history of withdrawal symptoms in the past. She denies any history of DTs. She denied using cocaine or marijuana at this time. Reported that she smokes 1 pack of cigarettes per day.   FAMILY HISTORY: She denied any family history of mental illness.   SOCIAL HISTORY: The patient is currently married and has been living with 3 children, ages 2515, 5912, and 41 years old. Her husband is currently not working. 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   PAST MEDICAL HISTORY: The patient denied having any medical problems at this time.   ALLERGIES: No known drug allergies.   REVIEW OF SYSTEMS: CONSTITUTIONAL: The patient currently denies having any fever or chills. No weight changes.  EYES: No double or blurred vision.  ENT: No hearing loss.  RESPIRATORY: No shortness of breath or cough. GASTROINTESTINAL: No abdominal pain, nausea, vomiting or diarrhea.  GENITOURINARY: No incontinence or frequency.  ENDOCRINE: No heat or cold intolerance.  LYMPHATIC: No anemia or easy bruising.  INTEGUMENTARY: No  acne or rash.  MUSCULOSKELETAL: No muscle or joint pain.  NEUROLOGIC: No tingling or weakness.   MENTAL STATUS EXAMINATION: The patient is a moderately built female who was lying in the bed. She maintained fair eye contact. Her speech was low in tone and volume. Mood was depressed and anxious. Affect was congruent. Thought process was logical, goal-directed. Thought content was nondelusional. She denied having any auditory or  visual hallucinations. She was unable to contract for safety and was buying box cutters to hurt herself. She demonstrated poor insight and judgment regarding her use of alcohol. Her language was normal. Fund of knowledge appeared appropriate. Her memory was intact. She is able to return back home after her discharge.   ANCILLARY DATA: Temperature 97.8, pulse 85, respirations 18, blood pressure 132/70.  LABORATORY DATA: Glucose 74, BUN 7, creatinine 0.84, sodium 136, potassium 3.9, chloride 105, bicarbonate 29, anion gap 2, osmolality 269, calcium 8.6. Blood alcohol less than 3. Protein 7.9, albumin 3.5, AST 25, ALT 25. Urine drug screen is positive for MDMA. WBC 13.7, MCV 91, RDW 14.8.   DIAGNOSTIC IMPRESSION: AXIS I: 1.  Bipolar disorder, not otherwise specified.  2.  Alcohol dependence.  AXIS II: Rule out personality disorder.   TREATMENT PLAN: The patient will be admitted to the inpatient behavioral health unit whenever the bed becomes available. She will be currently monitored in the Emergency Room at this time. I will start her on the medications including venlafaxine 150 mg p.o. daily. She will also be given Ativan  1 mg p.o. q. 6 hours p.r.n. for alcohol withdrawal symptoms. She will also be started on lithium 300 mg at bedtime. For mood stabilization, the patient will continue on gabapentin 300 mg p.o. b.i.d. I advised the patient to advise [place] if she notices worsening of symptoms or if she notices worsening of suicidal ideation, and she demonstrated understanding.   Thank you for allowing me to participate in the care of this patient.   ____________________________ Ardeen Fillers. Garnetta Buddy, MD usf:sb D: 03/14/2013 11:23:50 ET T: 03/14/2013 11:48:28 ET JOB#: [id number]  cc: Ardeen Fillers. Garnetta Buddy, MD, <Dictator> Rhunette Croft MD ELECTRONICALLY SIGNED 03/19/2013 12:03

## 2014-04-26 NOTE — H&P (Signed)
PATIENT NAME:  [name], [name] MR#:  [id number] DATE OF BIRTH:  1975-08-05  DATE OF ADMISSION:  06/20/2013 DATE OF ASSESSMENT:  06/21/2013    IDENTIFYING INFORMATION AND CHIEF COMPLAINT: A 39 year old woman who brought herself to the Emergency Room because of worsening suicidal ideation.  The patient's chief complaint, "I just feel worthless."   HISTORY OF PRESENT ILLNESS: Information obtained from the patient and the chart. The patient has had depression that has been going on for years. She has been feeling particularly worse for the last week or so. She had not been able to afford all of her medicines, but had been taking her Effexor and Wellbutrin. Now, she has not had any of that for the last couple of days. Nevertheless, she has had worsening symptoms of depression and suicidal thoughts with thoughts of thinking that she was worthless and did not deserve to live. Lots of extreme negativity towards herself. She has been feeling tired and run down. Feels like she has no motivation to do anything. Does not describe hallucinations or any psychotic symptoms. Does not describe homicidal ideation. She says she has not been drinking or using any drugs. A major acute stress is that her husband discovered that she had had an affair.   PAST PSYCHIATRIC HISTORY:  She has had a couple of previous admissions to our hospital going back a couple of years. Has been diagnosed with depression with the possibility of bipolar type II. She has been treated with antidepressants, antipsychotics and mood stabilizers. She says she does not think any of it was particularly helpful, although in the past it had looked like when she had been sober and stayed on some of her antidepressants, things did get a little bit better. She says she does think that the Effexor at least helps her with her crying spells. She has a history of self-mutilation quite a bit in the past.   SOCIAL HISTORY: Lives with her husband. They have been  married about 16 years and have 3 children; ages 7316, 2912, and 3810. The patient used to work several jobs but has gotten laid off and now is not working outside the home.   PAST MEDICAL HISTORY:  Dyslipidemia, takes simvastatin.   FAMILY HISTORY:  None identified.   SUBSTANCE ABUSE HISTORY:  Has a history of abusing primarily alcohol, sometimes other drugs.  The patient characterizes it as self-medication and denies that she has been using any recently.   CURRENT MEDICATIONS:  Simvastatin 20 mg at bedtime, Effexor extended release 150 mg once a day, Wellbutrin 75 mg once a day, Neurontin 300 mg twice a day, lithium carbonate 600 mg twice a day and Abilify 5 mg a day, although she has not been taking that.   ALLERGIES:  No known drug allergies.   REVIEW OF SYSTEMS:  Depressed mood, sadness, suicidal ideation. Feels fatigued all the time, no motivation. Denies psychotic symptoms. The rest of the medical review of systems is negative.   MENTAL STATUS EXAMINATION:  Slightly disheveled woman, looks her stated age or older. Eye contact poor. Psychomotor activity restricted. Speech is mumbling, sometimes hard to understand. Affect is anxious, dysphoric, on the verge of tears at times. Mood stated as bad. Thoughts are not obviously psychotic, but often scattered because of her emotional state. Denies auditory or visual hallucinations. Endorses suicidal thoughts without specific plan. Denies homicidal ideation. Insight and judgment adequate. Short and long-term memory intact. Fund of knowledge normal.   PHYSICAL EXAMINATION: SKIN:  No acute skin  lesions.  HEENT:  Pupils equal and reactive. Face symmetric. Oral mucosa dry.  NECK AND BACK:  Nontender.  NEUROLOGIC: Full range of motion at all extremities. Normal gait. Strength and reflexes symmetric and normal. Cranial nerves symmetric and normal.  LUNGS:  Clear without wheezes.  HEART:  Regular rate and rhythm.  ABDOMEN:  Soft, nontender, normal bowel  sounds.  VITAL SIGNS:  Temperature 98.4, pulse 71, respirations 20, blood pressure 112/70.   LABORATORY RESULTS:  Admission labs to the Emergency Room include a drug screen positive for amphetamines and MDMA. The MDMA could be related to the Wellbutrin. I am not sure where the amphetamines would be coming from. Chemistry shows a low potassium at 3.3, low AST at 13. Alcohol level was negative. CBC shows a slightly elevated white count at 13.2.  Urinalysis: Red blood cells, doubt infection.   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 Has been off of her medicine. Has been under a lot of stress. Returns with suicidal ideation. Needs hospitalization for stabilization and safety.   TREATMENT PLAN: Admit to psychiatry. Suicide precautions. I will not restart the Abilify, since she cannot afford it, but I will restart her lithium and Effexor. P.r.n. trazodone at night. Engage in group and individual therapy and work on discharge planning.   DIAGNOSIS, PRINCIPAL AND PRIMARY:  AXIS I:  Major depression, recurrent, severe.   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  AXIS II:  Deferred.  AXIS III:  History of dyslipidemia.  AXIS IV:  Severe from a conflict with her husband.  AXIS V:  Functioning at time of evaluation is 30.      ____________________________ Shelia Amel, MD jtc:dmm D: 06/21/2013 18:37:00 ET T: 06/21/2013 19:20:11 ET JOB#: [id number]  cc: Shelia Amel, MD, <Dictator> Shelia Amel MD ELECTRONICALLY SIGNED 06/25/2013 17:11

## 2014-04-26 NOTE — Discharge Summary (Signed)
PATIENT NAME:  [name], Sanaia MR#:  [id number] DATE OF BIRTH:  05/02/1975  DATE OF ADMISSION:  06/21/2013 DATE OF DISCHARGE:  06/25/2013  HOSPITAL COURSE: See dictated history and physical for details of admission. A 39 year old woman with a history of recurrent depression and a history of substance abuse came to the hospital with suicidal ideation and depressed mood. In the hospital, she has been cooperative with treatment and has been treated with medication and individual and group psychotherapy. She has been cooperative and compliant with medication. She has shown improved insight and cooperated well in groups. We have made a few adjustments to her medicine. I had initially added back her lithium, but she felt that that was oversedating and had not helped her in the past. It was discontinued and she is now on Effexor at a total of 225 mg a day and Wellbutrin 300 mg a day. Side effects are minimal to none. Totally denies suicidal ideation. She states an agreement to continue following up with [facility]. She has been counseled about the importance of staying sober and continuing to avoid alcohol and other drugs of abuse as they are likely to just make her mood and coping worse and she agrees to this.   DISCHARGE MENTAL STATUS EXAMINATION: Casually dressed, adequately groomed woman, looks her stated age, cooperative with the interview. Good eye contact. Normal psychomotor activity. Speech normal rate, tone and volume. Affect euthymic, reactive, appropriate. Mood stated as okay. Thoughts are lucid. No loosening of associations or delusions. Denies auditory or visual hallucinations. Denies suicidal or homicidal ideation. Adequate judgment and insight. Normal intelligence. Alert and oriented x 4.   LABORATORY RESULTS: Admission labs included a drug screen positive for amphetamines and MDMA. Urinalysis probably contaminated, lots of epithelial cells and casts, many red blood cells. CBC: Elevated white count 13.2,  otherwise unremarkable. Chemistry panel: Elevated glucose 106, low potassium 3.3. Alcohol negative. Acetaminophen and salicylates unremarkable.   DISCHARGE MEDICATIONS: Gabapentin 300 mg twice a day, bupropion SR 150 mg twice a day, venlafaxine extended release 225 mg a day, simvastatin 20 mg at night, trazodone 100 mg at night as needed for sleep, hydroxyzine 50 mg q. 6 hours as needed for anxiety.   DISPOSITION: Discharge home with her family. Follow up with [facility].   DIAGNOSIS, PRINCIPAL AND PRIMARY:  AXIS I: Major depression, recurrent, severe.   SECONDARY DIAGNOSES:  AXIS I: Polysubstance dependence in early remission.  AXIS II: Borderline traits at least.  AXIS III: Dyslipidemia.  AXIS IV: Moderate from stress in her home life.  AXIS V: Functioning at time of discharge is 55.    ____________________________ Audery AmelJohn T. Clapacs, MD jtc:dd D: 06/25/2013 12:20:42 ET T: 06/25/2013 19:15:38 ET JOB#: [id number]  cc: Audery AmelJohn T. Clapacs, MD, <Dictator> Audery AmelJOHN T CLAPACS MD ELECTRONICALLY SIGNED 06/25/2013 22:20

## 2014-04-27 NOTE — H&P (Signed)
PATIENT NAME:  Shelia HopesDRAKE, Sagal MR#:  [id number] DATE OF BIRTH:  August 15, 1975  DATE OF ADMISSION:  02/24/2011  IDENTIFYING INFORMATION AND CHIEF COMPLAINT: This is a 39 year old woman who came to the emergency room brought by her family because she had been cutting herself.   CHIEF COMPLAINT: "My husband thought I needed help."   HISTORY OF PRESENT ILLNESS: The patient reports that her mood has been particularly depressed recently. She stays down and depressed. Also gets irritable at times. Recently she has been having more thoughts that she would be better off dead and everyone would be better off without her. Today her husband found her in the shower cutting herself with a razor along the arms and insisted she come to the hospital. The patient says that in the last couple of days she has been tired and sleeping almost all the time. Appetite has been bad. Her husband reports that there has been an increase in irritability and hostility on the patient's part at home. The patient complains of stress related to the job she is currently doing. She is working at [employer] but says that she cannot stand it because she feels like people are out to get her. She has been chronically unable to hold down a regular job. She has three children at home and feels guilty that she cannot do a better job taking care of him. She does not reporting any hallucinations or clear-cut psychotic symptoms. She had not been taking any psychiatric medicine for about a year. Just two days ago she restarted taking Prozac. The dose is probably 40 mg a day. She admits that she has been drinking more than usual. She has been drinking two large size beers a day, but at times has drunk as much as half of a regular size bottle of liquor in a day. She says that she feels like it helps her cope, but she realizes that it is adding to the problem. She denies that she is using any other drugs. She says that her symptoms have been worse in the last  three years since her father died, but clearly things have been even worse more recently.   PAST PSYCHIATRIC HISTORY: She says that she has had problems with her mood since she was a teenager. She will have periods of time when she is feeling very down and depressed and also periods of time when she is feeling very up. It is not clear whether anyone has ever thought of her as having bipolar disorder. The medications she has taken in the past are primarily Prozac and briefly Wellbutrin. She thought they were partially helpful. She has never been in a psychiatric hospital. She denies she has ever made a serious suicide attempt. She does have a past history of self-mutilation.   FAMILY HISTORY: She denies any family history of mental illness.   SOCIAL HISTORY: The patient is married and they have three children at home. Children are between the ages of 41 and 6513. Her husband is currently out of work. The patient has been working at [employer], but she says that she hates it. The husband's family has been trying to help out at home and the mother-in-law apparently was instrumental in trying to get the patient to get treatment again.   PAST MEDICAL HISTORY: The patient does not have significant specific medical problems. She does say that she has chronic back pain and joint pain for which she has taken Neurontin in the past.   CURRENT  MEDICATIONS: Prozac 40 mg a day.   ALLERGIES: No known drug allergies.   REVIEW OF SYSTEMS: Complains of being tired, being depressed, having suicidal ideation and feeling excessively guilty and feeling hopeless. She does not have acute hallucinations or psychotic symptoms. Energy level has been down and appetite down.   MENTAL STATUS EXAMINATION: Carollee Sires in the emergency room. Somewhat disheveled. Cooperative but a bit passive. Eye contact decreased. Psychomotor activity slow. Speech is quiet but easy to understand. Decreased in total amount. Affect is flat. Mood is  stated as being depressed. Thoughts are lucid. No obvious loosening of associations or delusional thinking. Denies hallucinations. Has passive suicidal ideation without an immediate plan. No homicidal ideation. Judgment and insight recently somewhat impaired.   PHYSICAL EXAMINATION:   GENERAL: The patient does not appear to be chronically ill.  SKIN: She has multiple obvious fresh superficial cuts on her left arm, a few on her right arm. Nothing deeper than the outer layer of the dermis, nothing requiring sutures right now. She does have some that were dressed with gauze on one part of her arm. No other skin lesions.   HEENT: Pupils are equal and reactive. Head is symmetric. Oropharynx appears intact, normal dentition. Moist mucosa.   NEUROLOGICAL: Cranial nerves are all intact.    LUNGS: Clear to auscultation. No wheezes.   HEART: Regular rate and rhythm.   ABDOMEN: Soft and nontender. Full range of motion in all extremities. Normal strength and reflexes throughout.  VITALS:  Blood pressure 132/86, respirations 18, pulse 70, temperature 98.2.   LABORATORY RESULTS: Drug screen negative. TSH normal. Alcohol level negative. Chemistry panel all entirely normal. CBC shows a slightly elevated white count of 13.3.   Urinalysis shows some red blood cells, a few white blood cells. Could be an infection, could be contaminated or represent menstrual period.   ASSESSMENT: This is a 39 year old woman with either recurrent depression or a type of bipolar disorder recently more irritable, agitated, depressed, and cutting herself. Requires hospitalization because of self injury, severity of depression, and out-of-control behavior at home.   TREATMENT PLAN: The patient is agreeable to voluntary admission to the hospital. Continue Prozac 40 mg a day. She says in the past she has also been on Wellbutrin and we may consider adding that within the next day. I will give her the Neurontin 300 mg three times  daily.  I will add trazodone as p.r.n. medicine for sleep if she needs something. The patient will be engaged in groups and activities on the unit. Psychoeducation, supportive and cognitive therapy will be done. We will try and get collateral information from the family. Work on referral to outpatient treatment.   DIAGNOSIS PRINCIPLE AND PRIMARY:   AXIS I: Depression, not otherwise specified.   SECONDARY DIAGNOSES:   AXIS I:  1. Rule out bipolar disorder.  2. Alcohol abuse.   AXIS II: Deferred.   AXIS III: Superficial cuts to arms requiring no further specific treatment.   AXIS IV: Moderate - chronic stress at home.   AXIS V: Functioning at time of evaluation 25. ____________________________ Audery Amel, MD jtc:slb D: 02/24/2011 16:29:24 ET T: 02/24/2011 16:46:44 ET JOB#: [id number]  cc: Audery Amel, MD, <Dictator> Audery Amel MD ELECTRONICALLY SIGNED 02/25/2011 13:53

## 2014-04-27 NOTE — Discharge Summary (Signed)
PATIENT NAME:  Shelia HopesDRAKE, Shelia MR#:  [id number] DATE OF BIRTH:  [birth year]  DATE OF ADMISSION:  02/24/2011 DATE OF DISCHARGE:  03/01/2011  HOSPITAL COURSE: See dictated History and Physical for details of admission. This 39 year old woman was brought into the hospital by her family because of concerns about self-mutilation and worsening depression. She admitted to symptoms of depression and anxiety and some suicidal thoughts. The patient gave a history of chronic anxiety and depression, also a degree of social anxiety that seems to border on the paranoid at times. She frequently has extremely negative ideation about herself and has had suicidal thoughts, although she did not behave in a suicidal manner in the hospital and denied any acute intent to harm herself. Treatment in the hospital was with serotonin reuptake inhibitor. She was started on venlafaxine and titrated up to a dose of 150 mg extended release a day which she tolerated. She also was given Ativan on an infrequent p.r.n. basis, trazodone 100 mg at night as needed, Neurontin 300 mg t.i.d. for anxiety. I also suggested that we try starting her on a low dose of Risperdal 0.5 mg b.i.d. at the time of discharge. The patient participated in groups appropriately, showed improved insight. By the time of discharge her mood and anxiety were improved. She denied suicidal ideation. She was counseled about the importance of staying in treatment and maintaining sobriety. She was agreeable to outpatient referral. She was discharged back home with follow-up to be arranged in the community.   LABORATORY DATA: Chemistry panel: No significant abnormality. White blood cell count slightly elevated at 11.4 but the rest of the CBC normal. Drug screen negative. TSH normal. Alcohol undetectable.   DISCHARGE MEDICATIONS:  1. Gabapentin 300 mg t.i.d.  2. Trazodone 100 mg at night p.r.n. for sleep.  3. Effexor Extended-Release 150 mg once a day. 4. Risperdal 0.5 mg  b.i.d.   MENTAL STATUS EXAM AT DISCHARGE: Alert and oriented, neatly groomed woman, cooperative with the interview. Good eye contact, normal psychomotor activity. Speech normal rate and tone. Affect a little bit blunted. Mood stated as okay. Thoughts are lucid and directed. No evidence of thought disorder or bizarre thinking. Denies auditory or visual hallucinations. Denies suicidal or homicidal ideation. Agreeable to outpatient treatment. Improved judgment and insight.   DIAGNOSIS, PRINCIPAL AND PRIMARY:  AXIS I: Depression, not otherwise specified.   SECONDARY DIAGNOSES:  AXIS I:  1. Anxiety, not otherwise specified.  2. Alcohol abuse.   AXIS II: Deferred.   AXIS III: No diagnosis.   AXIS IV: Moderate-to-severe  stress from difficulty maintaining a job, Geneticist, molecularsocial isolation.   AXIS V: Functioning at time of discharge 60.   ____________________________ Audery AmelJohn T. Geneve Kimpel, MD jtc:cbb D: 03/04/2011 16:30:30 ET T: 03/05/2011 08:41:29 ET JOB#: [id number]  cc: Audery AmelJohn T. Jermika Olden, MD, <Dictator> Audery AmelJOHN T Ryleigh Buenger MD ELECTRONICALLY SIGNED 03/05/2011 9:54

## 2014-09-06 ENCOUNTER — Emergency Department
Admission: EM | Admit: 2014-09-06 | Discharge: 2014-09-07 | Disposition: A | Discharge status: Home / Self Care | Payer: No Typology Code available for payment source | Attending: Emergency Medicine | Admitting: Emergency Medicine

## 2014-09-06 ENCOUNTER — Encounter: Payer: Self-pay | Admitting: Emergency Medicine

## 2014-09-06 DIAGNOSIS — F609 Personality disorder, unspecified: Secondary | ICD-10-CM

## 2014-09-06 DIAGNOSIS — F6089 Other specific personality disorders: Secondary | ICD-10-CM

## 2014-09-06 DIAGNOSIS — Z79899 Other long term (current) drug therapy: Secondary | ICD-10-CM | POA: Insufficient documentation

## 2014-09-06 DIAGNOSIS — T43212A Poisoning by selective serotonin and norepinephrine reuptake inhibitors, intentional self-harm, initial encounter: Secondary | ICD-10-CM | POA: Insufficient documentation

## 2014-09-06 DIAGNOSIS — F1510 Other stimulant abuse, uncomplicated: Secondary | ICD-10-CM | POA: Insufficient documentation

## 2014-09-06 DIAGNOSIS — Y998 Other external cause status: Secondary | ICD-10-CM | POA: Insufficient documentation

## 2014-09-06 DIAGNOSIS — Z720 Tobacco use: Secondary | ICD-10-CM | POA: Insufficient documentation

## 2014-09-06 DIAGNOSIS — Y9389 Activity, other specified: Secondary | ICD-10-CM | POA: Insufficient documentation

## 2014-09-06 DIAGNOSIS — Y92009 Unspecified place in unspecified non-institutional (private) residence as the place of occurrence of the external cause: Secondary | ICD-10-CM | POA: Insufficient documentation

## 2014-09-06 DIAGNOSIS — IMO0001 Reserved for inherently not codable concepts without codable children: Secondary | ICD-10-CM

## 2014-09-06 LAB — COMPREHENSIVE METABOLIC PANEL
ALBUMIN: 4 g/dL (ref 3.5–5.0)
ALK PHOS: 54 U/L (ref 38–126)
ALT: 14 U/L (ref 14–54)
AST: 20 U/L (ref 15–41)
Anion gap: 5 (ref 5–15)
BILIRUBIN TOTAL: 0.4 mg/dL (ref 0.3–1.2)
BUN: 17 mg/dL (ref 6–20)
CALCIUM: 9.1 mg/dL (ref 8.9–10.3)
CO2: 27 mmol/L (ref 22–32)
CREATININE: 0.75 mg/dL (ref 0.44–1.00)
Chloride: 107 mmol/L (ref 101–111)
GFR calc Af Amer: >60 mL/min (ref >60)
GLUCOSE: 109 mg/dL — AB (ref 65–99)
POTASSIUM: 3.7 mmol/L (ref 3.5–5.1)
Sodium: 139 mmol/L (ref 135–145)
TOTAL PROTEIN: 7.5 g/dL (ref 6.5–8.1)

## 2014-09-06 LAB — CBC WITH DIFFERENTIAL/PLATELET
BASOS ABS: 0.1 10*3/uL (ref 0–0.1)
BASOS PCT: 1 %
EOS ABS: 0.5 10*3/uL (ref 0–0.7)
EOS PCT: 4 %
HCT: 41.1 % (ref 35.0–47.0)
Hemoglobin: 14 g/dL (ref 12.0–16.0)
LYMPHS PCT: 28 %
Lymphs Abs: 3.2 10*3/uL (ref 1.0–3.6)
MCH: 30.7 pg (ref 26.0–34.0)
MCHC: 34 g/dL (ref 32.0–36.0)
MCV: 90.3 fL (ref 80.0–100.0)
MONO ABS: 0.7 10*3/uL (ref 0.2–0.9)
Monocytes Relative: 6 %
Neutro Abs: 7 10*3/uL — ABNORMAL HIGH (ref 1.4–6.5)
Neutrophils Relative %: 61 %
PLATELETS: 238 10*3/uL (ref 150–440)
RBC: 4.55 MIL/uL (ref 3.80–5.20)
RDW: 13.6 % (ref 11.5–14.5)
WBC: 11.6 10*3/uL — AB (ref 3.6–11.0)

## 2014-09-06 LAB — URINE DRUG SCREEN, QUALITATIVE (ARMC ONLY)
Amphetamines, Ur Screen: POSITIVE — AB
BARBITURATES, UR SCREEN: NOT DETECTED
BENZODIAZEPINE, UR SCRN: NOT DETECTED
CANNABINOID 50 NG, UR ~~LOC~~: NOT DETECTED
COCAINE METABOLITE, UR ~~LOC~~: NOT DETECTED
MDMA (Ecstasy)Ur Screen: NOT DETECTED
METHADONE SCREEN, URINE: NOT DETECTED
Opiate, Ur Screen: NOT DETECTED
Phencyclidine (PCP) Ur S: NOT DETECTED
TRICYCLIC, UR SCREEN: NOT DETECTED

## 2014-09-06 LAB — ACETAMINOPHEN LEVEL: Acetaminophen (Tylenol), Serum: <10 ug/mL — ABNORMAL LOW (ref 10–30)

## 2014-09-06 LAB — LITHIUM LEVEL: Lithium Lvl: <0.06 mmol/L — ABNORMAL LOW (ref 0.60–1.20)

## 2014-09-06 LAB — SALICYLATE LEVEL: Salicylate Lvl: <4 mg/dL (ref 2.8–30.0)

## 2014-09-06 LAB — ETHANOL

## 2014-09-06 MED ORDER — IBUPROFEN 600 MG PO TABS
ORAL_TABLET | ORAL | Status: AC
  Filled 2014-09-06: qty 1

## 2014-09-06 MED ORDER — IBUPROFEN 600 MG PO TABS
600.0000 mg | ORAL_TABLET | Freq: Once | ORAL | Status: AC
  Administered 2014-09-06: 600 mg via ORAL

## 2014-09-06 NOTE — ED Notes (Signed)
ENVIRONMENTAL ASSESSMENT Potentially harmful objects out of patient reach: Yes.  Patient has one on one sitter. Personal belongings secured: Yes.   Patient dressed in hospital provided attire only: Yes.   Plastic bags out of patient reach: Yes.   Patient care equipment (cords, cables, call bells, lines, and drains) shortened, removed, or accounted for: Yes.   Equipment and supplies removed from bottom of stretcher: Yes.   Potentially toxic materials out of patient reach: Yes.   [place] container removed or out of patient reach: No. Patient has one on one sitter

## 2014-09-06 NOTE — ED Notes (Signed)

## 2014-09-06 NOTE — ED Notes (Signed)
ENVIRONMENTAL ASSESSMENT Potentially harmful objects out of patient reach: Yes.  Patient has one on one sitter Personal belongings secured: Yes.   Patient dressed in hospital provided attire only: Yes.   Plastic bags out of patient reach: Yes.   Patient care equipment (cords, cables, call bells, lines, and drains) shortened, removed, or accounted for: Yes.   Equipment and supplies removed from bottom of stretcher: Yes.   Potentially toxic materials out of patient reach: Yes.   [place] container removed or out of patient reach: Yes.    

## 2014-09-06 NOTE — ED Provider Notes (Signed)
Poison control recommends 24 hours on the monitor in case she did take Effexor XR and the overdose. Also checked a Depakote level which showed early wasn't done yet and keep her on the monitor and do another EKG at least one more  Arnaldo Natal, MD 09/06/14 2010

## 2014-09-06 NOTE — ED Notes (Signed)
ENVIRONMENTAL ASSESSMENT Potentially harmful objects out of patient reach: No. Patient has one on one sitter. Personal belongings secured: Yes.   Patient dressed in hospital provided attire only: Yes.   Plastic bags out of patient reach: Yes.   Patient care equipment (cords, cables, call bells, lines, and drains) shortened, removed, or accounted for: Yes.   Equipment and supplies removed from bottom of stretcher: Yes.   Potentially toxic materials out of patient reach: Yes.   Sharps container removed or out of patient reach: Yes.

## 2014-09-06 NOTE — ED Notes (Signed)
ENVIRONMENTAL ASSESSMENT Potentially harmful objects out of patient reach: Yes.  Patient has one on one sitter Personal belongings secured: Yes.   Patient dressed in hospital provided attire only: Yes.   Plastic bags out of patient reach: Yes.   Patient care equipment (cords, cables, call bells, lines, and drains) shortened, removed, or accounted for: Yes.   Equipment and supplies removed from bottom of stretcher: Yes.   Potentially toxic materials out of patient reach: Yes.   Sharps container removed or out of patient reach: Yes.

## 2014-09-06 NOTE — ED Notes (Signed)
ENVIRONMENTAL ASSESSMENT Potentially harmful objects out of patient reach: YES Personal belongings secured: YES Patient dressed in hospital provided attire only: YES Plastic bags out of patient reach: YES Patient care equipment (cords, cables, call bells, lines, and drains) shortened, removed, or accounted for: YES Equipment and supplies removed from bottom of stretcher: YES Potentially toxic materials out of patient reach: [place] container removed or out of patient reach: no

## 2014-09-06 NOTE — ED Notes (Signed)
BEHAVIORAL HEALTH ROUNDING Patient sleeping: No. Patient alert and oriented: yes Behavior appropriate: Yes.  ; If no, describe:  Nutrition and fluids offered: Yes  Toileting and hygiene offered: Yes  Sitter present: yes Law enforcement present: Yes  

## 2014-09-06 NOTE — Consult Note (Signed)
[facility] Consult   Reason for Consult:  Suicide attempt via overdose on Effexor Referring Physician:  Daymon Larsen, MD  Patient Identification: [name] MRN:  [id number]   Principal Diagnosis: Cluster B personality disorder  Diagnosis:   Patient Active Problem List   Diagnosis Date Noted  . 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 Bipolar affective disorder [F31.9] 09/06/2011   Total Time spent with patient: 30 minutes  Subjective:   [name] is a 39 y.o. female patient admitted with an overdose on approximately 15-20 tablets of Effexor about 30 minutes prior to her arrival to the ED. This occurred when she was involved in a verbal altercation with her husband. Pt was calm and cooperative with her in the ED. It is noted in nursing notes that the pt has a h/o SI attempts and cutting.   Per Dr. Theodosia Paling note, the patient was brought to the ED by police after having abn argument with her husband.  Pt has attempted suicide in the past by cutting her wrists. The pt provided Dr. Marcelene Butte with vague answers in regards to current suicidality stating, "wanted to sleep for a long time."  She shared with the ED provider that the pt called an ambulance because she was afraid she would "take more pills." Pt denied to Dr. Marcelene Butte that she ingested any other substances.   Of note, [name]'s UDS is positive for amphetamines. Salicylate and acetaminophen are negative. Ethanol is absent. CBC demonstrates a slightly elevated WBC of 11.6 and CMP reveals serum glucose of 109.   Today on interview, [name] reports her day started off poorly. She was vague in stating what occurred but later shared she was upset this morning. She needed to use her ex-husband's car to run an errand. She attempted to contact him and found him at his mother's home. He stated she could use the vehicle around 10am. The pt became upset and began to cry, become violent and states she was  feeling more depressed. She described depression and worthlessness, hopelessness and feeling helpless.  She shared she feels like a loser and a failure.   [name] shared she was upset until her ex-husband arrived. "I kept crying and I was depressed." She notes she took a handful of Effexor. With further discussion, it appears she took 15-20 Effexor 166m tablets.  Out of fear she would take more tablets, she called the crisis center who [facility]  The pt was then brought to the ED.   During the interview, Ms. DRoneshared she overdosed on Effexor "To go to sleep permanently." She is not remorseful of the attempt and stated, "I wish I took more pills." She shared protective factors include her 3 children aged 129 176and 89years. She stated on at least 2 occasions during this interview, "Life would be easier if I weren't around." She is not willing to contract for safety.   She describes her relationships with family are unstable. She noted sleep stable along with appetite. She denies anhedonia and guilt.  She denies SI over the past 2 weeks other than for today and she endorses decreased concentration. She shared her last manic episode was about 2 years ago but she shared she was using marijuana at that time.   Currently the pt denies SI, HI and AVH.   Her current medication regimen is as follows: 1. Effexor 4530mpo Qam. 2. Abilify 1 tablet po QD (  pt does not recall dosage).  3. Wellbutrin 1 tablet po QD (pt does not recall dosage). 4. Neurontin 3 tablets po QD (pt does not recall dosage).  HPI:  See above HPI Elements:   Location:  this occured at home. Quality:  acute flare but now improved and stable. . Severity:  moderate. Timing:  immediately following an argument with her husband today. Duration:  several minutes. Context:  argument with husband.  Past Psychiatric History: Psychiatrist: [facility] Therapist: [facility] Hospitalizations: [facility] and in  [place], [place] ECT: Denies Suicide attempt/Self-harm: Endorses Homicide attempts/harming others: Denies Past Medication Trials: Denies  Past Medical History:  Past Medical History  Diagnosis Date  . Mental disorder   . Depression     Past Surgical History  Procedure Laterality Date  . Cholecystectomy    . Abdominal hysterectomy     Family History: No family history on file. Social History:  History  Alcohol Use  . Yes    Comment: 48 oz beer qod     History  Drug Use No    Social History   Social History  . Marital Status: Married    Spouse Name: N/A  . Number of Children: N/A  . Years of Education: N/A   Social History Main Topics  . Smoking status: Current Every Day Smoker -- 1.00 packs/day for 16 years  . Smokeless tobacco: None     Comment: given info about patches and 2 wk free supply after discharge  . Alcohol Use: Yes     Comment: 48 oz beer qod  . Drug Use: No  . Sexual Activity: Yes    Birth Control/ Protection: Surgical   Other Topics Concern  . None   Social History Narrative   Additional Social History: Lives with her ex-husband.  Has 3 children as noted above.  Has an associate's degree  Allergies:  No Known Allergies  Labs:  Results for orders placed or performed during the hospital encounter of 09/06/14 (from the past 48 hour(s))  Acetaminophen level     Status: Abnormal   Collection Time: 09/06/14  1:41 PM  Result Value Ref Range   Acetaminophen (Tylenol), Serum <10 (L) 10 - 30 ug/mL    Comment:        THERAPEUTIC CONCENTRATIONS VARY SIGNIFICANTLY. A RANGE OF 10-30 ug/mL MAY BE AN EFFECTIVE CONCENTRATION FOR MANY PATIENTS. HOWEVER, SOME ARE BEST TREATED AT CONCENTRATIONS OUTSIDE THIS RANGE. ACETAMINOPHEN CONCENTRATIONS >150 ug/mL AT 4 HOURS AFTER INGESTION AND >50 ug/mL AT 12 HOURS AFTER INGESTION ARE OFTEN ASSOCIATED WITH TOXIC REACTIONS.   Comprehensive metabolic panel     Status: Abnormal   Collection Time: 09/06/14   1:41 PM  Result Value Ref Range   Sodium 139 135 - 145 mmol/L   Potassium 3.7 3.5 - 5.1 mmol/L   Chloride 107 101 - 111 mmol/L   CO2 27 22 - 32 mmol/L   Glucose, Bld 109 (H) 65 - 99 mg/dL   BUN 17 6 - 20 mg/dL   Creatinine, Ser 0.75 0.44 - 1.00 mg/dL   Calcium 9.1 8.9 - 10.3 mg/dL   Total Protein 7.5 6.5 - 8.1 g/dL   Albumin 4.0 3.5 - 5.0 g/dL   AST 20 15 - 41 U/L   ALT 14 14 - 54 U/L   Alkaline Phosphatase 54 38 - 126 U/L   Total Bilirubin 0.4 0.3 - 1.2 mg/dL   GFR calc non Af Amer >60 >60 mL/min   GFR calc Af Amer >60 >60 mL/min  Comment: (NOTE) The eGFR has been calculated using the CKD EPI equation. This calculation has not been validated in all clinical situations. eGFR's persistently <60 mL/min signify possible Chronic Kidney Disease.    Anion gap 5 5 - 15  CBC with Differential/Platelet     Status: Abnormal   Collection Time: 09/06/14  1:41 PM  Result Value Ref Range   WBC 11.6 (H) 3.6 - 11.0 K/uL   RBC 4.55 3.80 - 5.20 MIL/uL   Hemoglobin 14.0 12.0 - 16.0 g/dL   HCT 41.1 35.0 - 47.0 %   MCV 90.3 80.0 - 100.0 fL   MCH 30.7 26.0 - 34.0 pg   MCHC 34.0 32.0 - 36.0 g/dL   RDW 13.6 11.5 - 14.5 %   Platelets 238 150 - 440 K/uL   Neutrophils Relative % 61 %   Neutro Abs 7.0 (H) 1.4 - 6.5 K/uL   Lymphocytes Relative 28 %   Lymphs Abs 3.2 1.0 - 3.6 K/uL   Monocytes Relative 6 %   Monocytes Absolute 0.7 0.2 - 0.9 K/uL   Eosinophils Relative 4 %   Eosinophils Absolute 0.5 0 - 0.7 K/uL   Basophils Relative 1 %   Basophils Absolute 0.1 0 - 0.1 K/uL  Ethanol     Status: None   Collection Time: 09/06/14  1:41 PM  Result Value Ref Range   Alcohol, Ethyl (B) <5 <5 mg/dL    Comment:        LOWEST DETECTABLE LIMIT FOR SERUM ALCOHOL IS 5 mg/dL FOR MEDICAL PURPOSES ONLY   Salicylate level     Status: None   Collection Time: 09/06/14  1:41 PM  Result Value Ref Range   Salicylate Lvl <2.1 2.8 - 30.0 mg/dL  Urine Drug Screen, Qualitative ([facility] only)     Status: Abnormal    Collection Time: 09/06/14  1:41 PM  Result Value Ref Range   Tricyclic, Ur Screen NONE DETECTED NONE DETECTED   Amphetamines, Ur Screen POSITIVE (A) NONE DETECTED   MDMA (Ecstasy)Ur Screen NONE DETECTED NONE DETECTED   Cocaine Metabolite,Ur [place] NONE DETECTED NONE DETECTED   Opiate, Ur Screen NONE DETECTED NONE DETECTED   Phencyclidine (PCP) Ur S NONE DETECTED NONE DETECTED   Cannabinoid 50 Ng, Ur [place] NONE DETECTED NONE DETECTED   Barbiturates, Ur Screen NONE DETECTED NONE DETECTED   Benzodiazepine, Ur Scrn NONE DETECTED NONE DETECTED   Methadone Scn, Ur NONE DETECTED NONE DETECTED    Comment: (NOTE) 975  Tricyclics, urine               Cutoff 1000 ng/mL 200  Amphetamines, urine             Cutoff 1000 ng/mL 300  MDMA (Ecstasy), urine           Cutoff 500 ng/mL 400  Cocaine Metabolite, urine       Cutoff 300 ng/mL 500  Opiate, urine                   Cutoff 300 ng/mL 600  Phencyclidine (PCP), urine      Cutoff 25 ng/mL 700  Cannabinoid, urine              Cutoff 50 ng/mL 800  Barbiturates, urine             Cutoff 200 ng/mL 900  Benzodiazepine, urine           Cutoff 200 ng/mL 1000 Methadone, urine  Cutoff 300 ng/mL 1100 1200 The urine drug screen provides only a preliminary, unconfirmed 1300 analytical test result and should not be used for non-medical 1400 purposes. Clinical consideration and professional judgment should 1500 be applied to any positive drug screen result due to possible 1600 interfering substances. A more specific alternate chemical method 1700 must be used in order to obtain a confirmed analytical result.  1800 Gas chromato graphy / mass spectrometry (GC/MS) is the preferred 1900 confirmatory method.     Vitals: Blood pressure 122/85, pulse 71, temperature 98.9 F (37.2 C), temperature source Oral, resp. rate 16, height 5' 8"  (1.727 m), weight 90.719 kg (200 lb), SpO2 98 %.  Risk to Self: Is patient at risk for suicide?: Yes Risk to Others:    Prior Inpatient Therapy:   Prior Outpatient Therapy:    No current facility-administered medications for this encounter.   Current Outpatient Prescriptions  Medication Sig Dispense Refill  . albuterol (PROVENTIL HFA;VENTOLIN HFA) 108 (90 BASE) MCG/ACT inhaler Inhale 2 puffs into the lungs every 6 (six) hours as needed. For shortness of breath    . divalproex (DEPAKOTE ER) 500 MG 24 hr tablet Take 1 tablet (500 mg total) by mouth daily. For mood 30 tablet 0  . gabapentin (NEURONTIN) 600 MG tablet Take 1/2 tablet twice (371m) a day and 1 tablet (6027m at bedtime for anxiety/pain/discomfort 60 tablet 0  . ibuprofen (ADVIL,MOTRIN) 200 MG tablet Take 400 mg by mouth every 6 (six) hours as needed. For pain    . lamoTRIgine (LAMICTAL) 100 MG tablet Take 1 tablet (100 mg total) by mouth daily. For mood 30 tablet 0  . risperiDONE (RISPERDAL) 1 MG tablet Take 1 tablet (1 mg total) by mouth 2 (two) times daily. For thoughts/mood 60 tablet 0  . traZODone (DESYREL) 100 MG tablet Take 1 tablet (100 mg total) by mouth at bedtime as needed for sleep. 30 tablet 0  . venlafaxine XR (EFFEXOR-XR) 150 MG 24 hr capsule Take 1 capsule (150 mg total) by mouth daily with breakfast. For depression/anxiety 30 capsule 0    Musculoskeletal: Strength & Muscle Tone: within normal limits Gait & Station: not assessed due to fatigue Patient leans: N/A  Psychiatric Specialty Exam: Physical Exam  ROS  Blood pressure 122/85, pulse 71, temperature 98.9 F (37.2 C), temperature source Oral, resp. rate 16, height 5' 8"  (1.727 m), weight 90.719 kg (200 lb), SpO2 98 %.Body mass index is 30.42 kg/(m^2).  General Appearance: Casual and Neat  Eye Contact::  Good  Speech:  Clear and Coherent and Normal Rate  Volume:  Normal  Mood:  Hopeless  Affect:  Constricted and fatigued  Thought Process:  Coherent, Goal Directed, Linear and Logical  Orientation:  Full (Time, Place, and Person)  Thought Content:  Negative  Suicidal  Thoughts:  No  Homicidal Thoughts:  No  Memory:  Immediate;   Good Recent;   Good Remote;   Good  Judgement:  Poor  Insight:  Lacking  Psychomotor Activity:  Decreased  Concentration:  Good  Recall:  Good  Fund of Knowledge:Good  Language: Good  Akathisia:  Negative  Handed:  Right  AIMS (if indicated):     Assets:  Communication Skills Desire for Improvement Financial Resources/Insurance Housing Leisure Time Physical Health Social Support Talents/Skills Transportation Vocational/Educational  ADL's:  Intact  Cognition: WNL  Sleep:  adequate   Medical Decision Making: Established Problem, Stable/Improving (1), Review of Psycho-Social Stressors (1), Review or order clinical lab tests (1), Review of  Medication Regimen & Side Effects (2) and Review of New Medication or Change in Dosage (2)  Treatment Plan Summary: Plan Will re-evaluate in the morning.   Plan:  see above Disposition: pending  Donita Brooks 09/06/2014 3:24 PM

## 2014-09-06 NOTE — ED Provider Notes (Signed)
Time Seen: Approximately ----------------------------------------- 2:11 PM on 09/06/2014 -----------------------------------------    I have reviewed the triage notes  Chief Complaint: Drug Overdose   History of Present Illness: [name] is a 39 y.o. female who presents with recent ingestion of the effects for her. Patient states she "" took a handful" of her prescription effects. At home. Unknown strength on her medication. Patient was provided here by police after having a verbal outburst with her husband. Patient has had a past history of suicide attempts via cutting of her wrists. She gives vague answers on whether or not she is suicidal and states she "" wanted to sleep for a long time "". She then goes on to state that she called an ambulance because she was afraid that she would "" take more pills "". She denies any coingestions. She denies any homicidal thoughts or hallucinations. She has no physical complaints.   Past Medical History  Diagnosis Date  . Mental disorder   . Depression     Patient Active Problem List   Diagnosis Date Noted  . Bipolar affective disorder 09/06/2011    Past Surgical History  Procedure Laterality Date  . Cholecystectomy    . Abdominal hysterectomy      Past Surgical History  Procedure Laterality Date  . Cholecystectomy    . Abdominal hysterectomy      Current Outpatient Rx  Name  Route  Sig  Dispense  Refill  . albuterol (PROVENTIL HFA;VENTOLIN HFA) 108 (90 BASE) MCG/ACT inhaler   Inhalation   Inhale 2 puffs into the lungs every 6 (six) hours as needed. For shortness of breath         . EXPIRED: divalproex (DEPAKOTE ER) 500 MG 24 hr tablet   Oral   Take 1 tablet (500 mg total) by mouth daily. For mood   30 tablet   0   . gabapentin (NEURONTIN) 600 MG tablet      Take 1/2 tablet twice (300mg ) a day and 1 tablet (600mg ) at bedtime for anxiety/pain/discomfort   60 tablet   0   . ibuprofen (ADVIL,MOTRIN) 200 MG  tablet   Oral   Take 400 mg by mouth every 6 (six) hours as needed. For pain         . lamoTRIgine (LAMICTAL) 100 MG tablet   Oral   Take 1 tablet (100 mg total) by mouth daily. For mood   30 tablet   0   . EXPIRED: risperiDONE (RISPERDAL) 1 MG tablet   Oral   Take 1 tablet (1 mg total) by mouth 2 (two) times daily. For thoughts/mood   60 tablet   0   . traZODone (DESYREL) 100 MG tablet   Oral   Take 1 tablet (100 mg total) by mouth at bedtime as needed for sleep.   30 tablet   0   . EXPIRED: venlafaxine XR (EFFEXOR-XR) 150 MG 24 hr capsule   Oral   Take 1 capsule (150 mg total) by mouth daily with breakfast. For depression/anxiety   30 capsule   0     Allergies:  Review of patient's allergies indicates no known allergies.  Family History: No family history on file.  Social History: Social History  Substance Use Topics  . Smoking status: Current Every Day Smoker -- 1.00 packs/day for 16 years  . Smokeless tobacco: None     Comment: given info about patches and 2 wk free supply after discharge  . Alcohol Use: Yes  Comment: 48 oz beer qod     Review of Systems:   10 point review of systems was performed and was otherwise negative:  Constitutional: No fever Eyes: No visual disturbances ENT: No sore throat, ear pain Cardiac: No chest pain Respiratory: No shortness of breath, wheezing, or stridor Abdomen: No abdominal pain, no vomiting, No diarrhea Endocrine: No weight loss, No night sweats Extremities: No peripheral edema, cyanosis Skin: No rashes, easy bruising Neurologic: No focal weakness, trouble with speech or swollowing Urologic: No dysuria, Hematuria, or urinary frequency   Physical Exam:  ED Triage Vitals  Enc Vitals Group     BP 09/06/14 1332 121/81 mmHg     Pulse Rate 09/06/14 1332 88     Resp 09/06/14 1332 18     Temp 09/06/14 1332 98.9 F (37.2 C)     Temp Source 09/06/14 1332 Oral     SpO2 09/06/14 1332 99 %     Weight 09/06/14  1332 200 lb (90.719 kg)     Height 09/06/14 1332  (1.727 m)     Head Cir --      Peak Flow --      Pain Score 09/06/14 1332 7     Pain Loc --      Pain Edu? --      Excl. in GC? --     General: Awake , Alert , and Oriented times 3; GCS 15 Head: Normal cephalic , atraumatic Eyes: Pupils equal , round, reactive to light Nose/Throat: No nasal drainage, patent upper airway without erythema or exudate.  Neck: Supple, Full range of motion, No anterior adenopathy or palpable thyroid masses Lungs: Clear to ascultation without wheezes , rhonchi, or rales Heart: Regular rate, regular rhythm without murmurs , gallops , or rubs Abdomen: Soft, non tender without rebound, guarding , or rigidity; bowel sounds positive and symmetric in all 4 quadrants. No organomegaly .        Extremities: 2 plus symmetric pulses. No edema, clubbing or cyanosis Neurologic: normal ambulation, Motor symmetric without deficits, sensory intact Skin: warm, dry, no rashes   Labs:   All laboratory work was reviewed including any pertinent negatives or positives listed below:  Labs Reviewed  ACETAMINOPHEN LEVEL  COMPREHENSIVE METABOLIC PANEL  CBC WITH DIFFERENTIAL/PLATELET  ETHANOL  SALICYLATE LEVEL  URINE DRUG SCREEN, QUALITATIVE ([facility] ONLY)  POC URINE PREG, ED    EKG: ED ECG REPORT I, Jennye Moccasin, the attending physician, personally viewed and interpreted this ECG.  Date: 09/06/2014 EKG Time: 1411 Rate: 70 Rhythm: normal sinus rhythm QRS Axis: normal Intervals: normal ST/T Wave abnormalities: normal Conduction Disutrbances: none Narrative Interpretation: unremarkable Normal EKG   Procedures: Patient's had scheduling for psychiatric consultation. I felt her ingestion was unlikely to be life threatening. Her QT interval on her EKG is normal and she'll be monitored here for a period of time but otherwise is medically cleared. EC paperwork is been established.    ED Course: Patient had her  involuntary commitment paperwork filled out prior to arrival was also established on papers here in emergency department. Psychiatric consult is been requested. She is not appear to have any coingestions at this time. I will add Depakote levels to her current laboratory work    Assessment: Suicidal ideation Effexor ingestion     Plan: Psychiatric consultation after medical clearance            Jennye Moccasin, MD 09/06/14 1454

## 2014-09-06 NOTE — ED Notes (Signed)
Ems pt from home , overdose of effexor approx 15 to 20 tabs ingested approx 30 min prior to arrival , [employer] with pt  , after having verbal outburst with her husband , pt currently calm and cooperative, with hx SI attempts /cutting

## 2014-09-07 ENCOUNTER — Other Ambulatory Visit: Payer: Self-pay

## 2014-09-07 LAB — MAGNESIUM: Magnesium: 1.8 mg/dL (ref 1.7–2.4)

## 2014-09-07 LAB — VALPROIC ACID LEVEL: Valproic Acid Lvl: <10 ug/mL — ABNORMAL LOW (ref 50.0–100.0)

## 2014-09-07 NOTE — ED Notes (Signed)
BEHAVIORAL HEALTH ROUNDING Patient sleeping: No. Patient alert and oriented: yes Behavior appropriate: Yes.  ; If no, describe:  Nutrition and fluids offered: Yes  Toileting and hygiene offered: Yes  Sitter present: q 15 min checks Law enforcement present: Yes  

## 2014-09-07 NOTE — ED Notes (Signed)
Patient verbalized need to come to ED if having thoughts of hurting self or others and to follow up with [facility] psychiatrist.  Patient denies SI at this time.

## 2014-09-07 NOTE — ED Notes (Signed)
BEHAVIORAL HEALTH ROUNDING Patient sleeping: Yes.   Patient alert and oriented: patient sleeping Behavior appropriate: Yes.  ; If no, describe: sleeping Nutrition and fluids offered: sleeping Toileting and hygiene offered: sleeping Sitter present: yes Law enforcement present: Yes

## 2014-09-07 NOTE — ED Notes (Signed)
Spoke with poison control. States everything looks good for patient medically. Will check again in AM.

## 2014-09-07 NOTE — ED Notes (Signed)
ENVIRONMENTAL ASSESSMENT Potentially harmful objects out of patient reach: No. Personal belongings secured: Yes.   Patient dressed in hospital provided attire only: Yes.   Plastic bags out of patient reach: Yes.   Patient care equipment (cords, cables, call bells, lines, and drains) shortened, removed, or accounted for: No. Equipment and supplies removed from bottom of stretcher: Yes.   Potentially toxic materials out of patient reach: Yes.   Sharps container removed or out of patient reach: No. 

## 2014-09-07 NOTE — ED Notes (Signed)
Sitter present, patient resting with eyes closed, respiration even and unlabored.

## 2014-09-07 NOTE — ED Provider Notes (Signed)
-----------------------------------------   1:31 PM on 09/07/2014 -----------------------------------------   Blood pressure 106/70, pulse 86, temperature 98.1 F (36.7 C), temperature source Oral, resp. rate 18, height  (1.727 m), weight 200 lb (90.719 kg), SpO2 96 %.  The patient had no acute events since last update.  Calm and cooperative at this time.  Patient is no longer feeling suicidal. No intention to herself at this time. Seen and evaluated by Dr. Jenne [facility] of psychiatry. Says that the patient is appropriate for discharge with outpatient follow-up.  ED ECG REPORT I, Arelia Longest, the attending physician, personally viewed and interpreted this ECG.   Date: 09/07/2014  EKG Time: 1310  Rate: 78  Rhythm: normal EKG, normal sinus rhythm  Axis: Normal axis  Intervals:none  ST&T Change: No ST segment elevation or depression. No abnormal T-wave inversions.  Patient also without any interval abnormalities on her EKG or arrhythmia. His medical cleared at this time will be discharged home. Will be able to follow-up with her outpatient psychiatry at [facility].     Myrna Blazer, MD 09/07/14 [phone]

## 2014-09-07 NOTE — ED Notes (Signed)
BEHAVIORAL HEALTH ROUNDING Patient sleeping: Yes.   Patient alert and oriented: sleeping Behavior appropriate: Yes.  ; If no, describe: sleeping Nutrition and fluids offered: sleeping Toileting and hygiene offered: sleeping Sitter present: yes Law enforcement present: Yes  

## 2014-09-07 NOTE — ED Notes (Signed)
ENVIRONMENTAL ASSESSMENT Potentially harmful objects out of patient reach: Yes.  Patient has one on one sitter Personal belongings secured: Yes.   Patient dressed in hospital provided attire only: Yes.   Plastic bags out of patient reach: Yes.   Patient care equipment (cords, cables, call bells, lines, and drains) shortened, removed, or accounted for: Yes.   Equipment and supplies removed from bottom of stretcher: Yes.   Potentially toxic materials out of patient reach: Yes.   [place] container removed or out of patient reach: Yes.    

## 2014-09-07 NOTE — Consult Note (Signed)
[facility]  Follow-Up Consult   Reason for Consult:  Suicide attempt via overdose on Effexor Referring Physician:  Daymon Larsen, MD  Patient Identification: [name] MRN:  [id number]   Principal Diagnosis: Cluster B personality disorder  Diagnosis:   Patient Active Problem List   Diagnosis Date Noted  . 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 Bipolar affective disorder [F31.9] 09/06/2011   Total Time spent with patient: 30 minutes  Subjective:   [name] is a 39 y.o. female patient admitted with an overdose on approximately 15-20 tablets of Effexor about 30 minutes prior to her arrival to the ED. This occurred when she was involved in a verbal altercation with her husband. Pt was calm and cooperative with her in the ED. It is noted in nursing notes that the pt has a h/o SI attempts and cutting.   Per Dr. Theodosia Paling note, the patient was brought to the ED by police after having abn argument with her husband.  Pt has attempted suicide in the past by cutting her wrists. The pt provided Dr. Marcelene Butte with vague answers in regards to current suicidality stating, "wanted to sleep for a long time."  She shared with the ED provider that the pt called an ambulance because she was afraid she would "take more pills." Pt denied to Dr. Marcelene Butte that she ingested any other substances.   Of note, Mrs. Cabello's UDS is positive for amphetamines. Salicylate and acetaminophen are negative. Ethanol is absent. CBC demonstrates a slightly elevated WBC of 11.6 and CMP reveals serum glucose of 109.   Today on interview, Ms. Kintzel reports she is doing well. She feels her mood is improved vs. Yesterday. She also shared she is remorseful for the suicide attempt and shared she is happy to be alive. Protective factors include her family, children and religion. She denies wanting to harm herself or others. She denies SI, HI and  AVH.    Pt states she feels safe at home and there  are no weapons at home. Pt has an upcoming appt with her psychiatrist Dr. Ernie Hew at [facility].    HPI Elements:   Location:  this occured at home. Quality:  acute flare but now improved and stable. . Severity:  moderate. Timing:  immediately following an argument with her husband today. Duration:  several minutes. Context:  argument with husband.  Past Psychiatric History: Psychiatrist: [facility] Therapist: [facility] Hospitalizations: [facility] and in [place], [place] ECT: Denies Suicide attempt/Self-harm: Endorses Homicide attempts/harming others: Denies Past Medication Trials: Denies  Past Medical History:  Past Medical History  Diagnosis Date  . Mental disorder   . Depression     Past Surgical History  Procedure Laterality Date  . Cholecystectomy    . Abdominal hysterectomy     Family History: No family history on file. Social History:  History  Alcohol Use  . Yes    Comment: 48 oz beer qod     History  Drug Use No    Social History   Social History  . Marital Status: Married    Spouse Name: N/A  . Number of Children: N/A  . Years of Education: N/A   Social History Main Topics  . Smoking status: Current Every Day Smoker -- 1.00 packs/day for 16 years  . Smokeless tobacco: None     Comment: given info about patches and 2 wk free supply after discharge  . Alcohol Use: Yes     Comment: 48 oz beer qod  .  Drug Use: No  . Sexual Activity: Yes    Birth Control/ Protection: Surgical   Other Topics Concern  . None   Social History Narrative   Additional Social History: Lives with her ex-husband.  Has 3 children as noted above.  Has an associate's degree  Allergies:  No Known Allergies  Labs:  Results for orders placed or performed during the hospital encounter of 09/06/14 (from the past 48 hour(s))  Acetaminophen level     Status: Abnormal   Collection Time: 09/06/14  1:41 PM  Result Value Ref Range   Acetaminophen (Tylenol), Serum <10 (L) 10 - 30 ug/mL    Comment:         THERAPEUTIC CONCENTRATIONS VARY SIGNIFICANTLY. A RANGE OF 10-30 ug/mL MAY BE AN EFFECTIVE CONCENTRATION FOR MANY PATIENTS. HOWEVER, SOME ARE BEST TREATED AT CONCENTRATIONS OUTSIDE THIS RANGE. ACETAMINOPHEN CONCENTRATIONS >150 ug/mL AT 4 HOURS AFTER INGESTION AND >50 ug/mL AT 12 HOURS AFTER INGESTION ARE OFTEN ASSOCIATED WITH TOXIC REACTIONS.   Comprehensive metabolic panel     Status: Abnormal   Collection Time: 09/06/14  1:41 PM  Result Value Ref Range   Sodium 139 135 - 145 mmol/L   Potassium 3.7 3.5 - 5.1 mmol/L   Chloride 107 101 - 111 mmol/L   CO2 27 22 - 32 mmol/L   Glucose, Bld 109 (H) 65 - 99 mg/dL   BUN 17 6 - 20 mg/dL   Creatinine, Ser 0.75 0.44 - 1.00 mg/dL   Calcium 9.1 8.9 - 10.3 mg/dL   Total Protein 7.5 6.5 - 8.1 g/dL   Albumin 4.0 3.5 - 5.0 g/dL   AST 20 15 - 41 U/L   ALT 14 14 - 54 U/L   Alkaline Phosphatase 54 38 - 126 U/L   Total Bilirubin 0.4 0.3 - 1.2 mg/dL   GFR calc non Af Amer >60 >60 mL/min   GFR calc Af Amer >60 >60 mL/min    Comment: (NOTE) The eGFR has been calculated using the CKD EPI equation. This calculation has not been validated in all clinical situations. eGFR's persistently <60 mL/min signify possible Chronic Kidney Disease.    Anion gap 5 5 - 15  CBC with Differential/Platelet     Status: Abnormal   Collection Time: 09/06/14  1:41 PM  Result Value Ref Range   WBC 11.6 (H) 3.6 - 11.0 K/uL   RBC 4.55 3.80 - 5.20 MIL/uL   Hemoglobin 14.0 12.0 - 16.0 g/dL   HCT 41.1 35.0 - 47.0 %   MCV 90.3 80.0 - 100.0 fL   MCH 30.7 26.0 - 34.0 pg   MCHC 34.0 32.0 - 36.0 g/dL   RDW 13.6 11.5 - 14.5 %   Platelets 238 150 - 440 K/uL   Neutrophils Relative % 61 %   Neutro Abs 7.0 (H) 1.4 - 6.5 K/uL   Lymphocytes Relative 28 %   Lymphs Abs 3.2 1.0 - 3.6 K/uL   Monocytes Relative 6 %   Monocytes Absolute 0.7 0.2 - 0.9 K/uL   Eosinophils Relative 4 %   Eosinophils Absolute 0.5 0 - 0.7 K/uL   Basophils Relative 1 %   Basophils Absolute 0.1 0 -  0.1 K/uL  Ethanol     Status: None   Collection Time: 09/06/14  1:41 PM  Result Value Ref Range   Alcohol, Ethyl (B) <5 <5 mg/dL    Comment:        LOWEST DETECTABLE LIMIT FOR SERUM ALCOHOL IS 5 mg/dL FOR MEDICAL PURPOSES  ONLY   Salicylate level     Status: None   Collection Time: 09/06/14  1:41 PM  Result Value Ref Range   Salicylate Lvl <3.9 2.8 - 30.0 mg/dL  Urine Drug Screen, Qualitative ([facility] only)     Status: Abnormal   Collection Time: 09/06/14  1:41 PM  Result Value Ref Range   Tricyclic, Ur Screen NONE DETECTED NONE DETECTED   Amphetamines, Ur Screen POSITIVE (A) NONE DETECTED   MDMA (Ecstasy)Ur Screen NONE DETECTED NONE DETECTED   Cocaine Metabolite,Ur [place] NONE DETECTED NONE DETECTED   Opiate, Ur Screen NONE DETECTED NONE DETECTED   Phencyclidine (PCP) Ur S NONE DETECTED NONE DETECTED   Cannabinoid 50 Ng, Ur [place] NONE DETECTED NONE DETECTED   Barbiturates, Ur Screen NONE DETECTED NONE DETECTED   Benzodiazepine, Ur Scrn NONE DETECTED NONE DETECTED   Methadone Scn, Ur NONE DETECTED NONE DETECTED    Comment: (NOTE) 532  Tricyclics, urine               Cutoff 1000 ng/mL 200  Amphetamines, urine             Cutoff 1000 ng/mL 300  MDMA (Ecstasy), urine           Cutoff 500 ng/mL 400  Cocaine Metabolite, urine       Cutoff 300 ng/mL 500  Opiate, urine                   Cutoff 300 ng/mL 600  Phencyclidine (PCP), urine      Cutoff 25 ng/mL 700  Cannabinoid, urine              Cutoff 50 ng/mL 800  Barbiturates, urine             Cutoff 200 ng/mL 900  Benzodiazepine, urine           Cutoff 200 ng/mL 1000 Methadone, urine                Cutoff 300 ng/mL 1100 1200 The urine drug screen provides only a preliminary, unconfirmed 1300 analytical test result and should not be used for non-medical 1400 purposes. Clinical consideration and professional judgment should 1500 be applied to any positive drug screen result due to possible 1600 interfering substances. A more specific  alternate chemical method 1700 must be used in order to obtain a confirmed analytical result.  1800 Gas chromato graphy / mass spectrometry (GC/MS) is the preferred 1900 confirmatory method.   Valproic acid level     Status: Abnormal   Collection Time: 09/06/14  2:00 PM  Result Value Ref Range   Valproic Acid Lvl <10 (L) 50.0 - 100.0 ug/mL  Magnesium     Status: None   Collection Time: 09/06/14  2:00 PM  Result Value Ref Range   Magnesium 1.8 1.7 - 2.4 mg/dL  Lithium level     Status: Abnormal   Collection Time: 09/06/14  8:19 PM  Result Value Ref Range   Lithium Lvl <0.06 (L) 0.60 - 1.20 mmol/L    Vitals: Blood pressure 124/84, pulse 76, temperature 98.1 F (36.7 C), temperature source Oral, resp. rate 20, height 5' 8"  (1.727 m), weight 90.719 kg (200 lb), SpO2 100 %.  Risk to Self: Is patient at risk for suicide?: Yes Risk to Others:   Prior Inpatient Therapy:   Prior Outpatient Therapy:    No current facility-administered medications for this encounter.   Current Outpatient Prescriptions  Medication Sig Dispense Refill  . albuterol (PROVENTIL  HFA;VENTOLIN HFA) 108 (90 BASE) MCG/ACT inhaler Inhale 2 puffs into the lungs every 6 (six) hours as needed. For shortness of breath    . ibuprofen (ADVIL,MOTRIN) 200 MG tablet Take 400 mg by mouth every 6 (six) hours as needed. For pain    . divalproex (DEPAKOTE ER) 500 MG 24 hr tablet Take 1 tablet (500 mg total) by mouth daily. For mood 30 tablet 0  . gabapentin (NEURONTIN) 600 MG tablet Take 1/2 tablet twice (35m) a day and 1 tablet (6027m at bedtime for anxiety/pain/discomfort 60 tablet 0  . lamoTRIgine (LAMICTAL) 100 MG tablet Take 1 tablet (100 mg total) by mouth daily. For mood 30 tablet 0  . risperiDONE (RISPERDAL) 1 MG tablet Take 1 tablet (1 mg total) by mouth 2 (two) times daily. For thoughts/mood 60 tablet 0  . traZODone (DESYREL) 100 MG tablet Take 1 tablet (100 mg total) by mouth at bedtime as needed for sleep. 30  tablet 0  . venlafaxine XR (EFFEXOR-XR) 150 MG 24 hr capsule Take 1 capsule (150 mg total) by mouth daily with breakfast. For depression/anxiety 30 capsule 0    Musculoskeletal: Strength & Muscle Tone: within normal limits Gait & Station: not assessed due to fatigue Patient leans: N/A  Psychiatric Specialty Exam: Physical Exam  ROS  Blood pressure 124/84, pulse 76, temperature 98.1 F (36.7 C), temperature source Oral, resp. rate 20, height 5' 8"  (1.727 m), weight 90.719 kg (200 lb), SpO2 100 %.Body mass index is 30.42 kg/(m^2).  General Appearance: Casual and Neat  Eye Contact::  Good  Speech:  Clear and Coherent and Normal Rate  Volume:  Normal  Mood:  Hopeless  Affect:  Constricted and fatigued  Thought Process:  Coherent, Goal Directed, Linear and Logical  Orientation:  Full (Time, Place, and Person)  Thought Content:  Negative  Suicidal Thoughts:  No  Homicidal Thoughts:  No  Memory:  Immediate;   Good Recent;   Good Remote;   Good  Judgement:  Poor  Insight:  Lacking  Psychomotor Activity:  Decreased  Concentration:  Good  Recall:  Good  Fund of Knowledge:Good  Language: Good  Akathisia:  Negative  Handed:  Right  AIMS (if indicated):     Assets:  Communication Skills Desire for Improvement Financial Resources/Insurance Housing Leisure Time Physical Health Social Support Talents/Skills Transportation Vocational/Educational  ADL's:  Intact  Cognition: WNL  Sleep:  adequate   Medical Decision Making: Established Problem, Stable/Improving (1), Review of Psycho-Social Stressors (1), Review or order clinical lab tests (1), Review of Medication Regimen & Side Effects (2) and Review of New Medication or Change in Dosage (2)  Treatment Plan Summary: Plan Discharge to home.   1. [facility]up [facility] on Tuesday.   Plan:  see above Disposition: pending  McDonita Brooks/04/2014 10:39 AM

## 2014-10-29 ENCOUNTER — Encounter: Payer: Self-pay | Admitting: Emergency Medicine

## 2014-10-29 ENCOUNTER — Emergency Department
Admission: EM | Admit: 2014-10-29 | Discharge: 2014-10-30 | Disposition: A | Discharge status: Other Acute Inpatient Hospital | Payer: Self-pay | Attending: Emergency Medicine | Admitting: Emergency Medicine

## 2014-10-29 DIAGNOSIS — S61502A Unspecified open wound of left wrist, initial encounter: Secondary | ICD-10-CM | POA: Insufficient documentation

## 2014-10-29 DIAGNOSIS — Z720 Tobacco use: Secondary | ICD-10-CM | POA: Insufficient documentation

## 2014-10-29 DIAGNOSIS — X788XXA Intentional self-harm by other sharp object, initial encounter: Secondary | ICD-10-CM | POA: Insufficient documentation

## 2014-10-29 DIAGNOSIS — F3341 Major depressive disorder, recurrent, in partial remission: Secondary | ICD-10-CM

## 2014-10-29 DIAGNOSIS — F1090 Alcohol use, unspecified, uncomplicated: Secondary | ICD-10-CM

## 2014-10-29 DIAGNOSIS — Y9389 Activity, other specified: Secondary | ICD-10-CM | POA: Insufficient documentation

## 2014-10-29 DIAGNOSIS — Z79899 Other long term (current) drug therapy: Secondary | ICD-10-CM | POA: Insufficient documentation

## 2014-10-29 DIAGNOSIS — F329 Major depressive disorder, single episode, unspecified: Secondary | ICD-10-CM | POA: Insufficient documentation

## 2014-10-29 DIAGNOSIS — Y9289 Other specified places as the place of occurrence of the external cause: Secondary | ICD-10-CM | POA: Insufficient documentation

## 2014-10-29 DIAGNOSIS — R45851 Suicidal ideations: Secondary | ICD-10-CM

## 2014-10-29 DIAGNOSIS — F332 Major depressive disorder, recurrent severe without psychotic features: Secondary | ICD-10-CM

## 2014-10-29 DIAGNOSIS — Z7289 Other problems related to lifestyle: Secondary | ICD-10-CM

## 2014-10-29 DIAGNOSIS — F419 Anxiety disorder, unspecified: Secondary | ICD-10-CM | POA: Insufficient documentation

## 2014-10-29 DIAGNOSIS — Y998 Other external cause status: Secondary | ICD-10-CM | POA: Insufficient documentation

## 2014-10-29 DIAGNOSIS — F1010 Alcohol abuse, uncomplicated: Secondary | ICD-10-CM

## 2014-10-29 DIAGNOSIS — Z23 Encounter for immunization: Secondary | ICD-10-CM | POA: Insufficient documentation

## 2014-10-29 LAB — COMPREHENSIVE METABOLIC PANEL
ALT: 21 U/L (ref 14–54)
AST: 21 U/L (ref 15–41)
Albumin: 3.9 g/dL (ref 3.5–5.0)
Alkaline Phosphatase: 72 U/L (ref 38–126)
Anion gap: 6 (ref 5–15)
BUN: 8 mg/dL (ref 6–20)
CHLORIDE: 102 mmol/L (ref 101–111)
CO2: 29 mmol/L (ref 22–32)
CREATININE: 0.79 mg/dL (ref 0.44–1.00)
Calcium: 9 mg/dL (ref 8.9–10.3)
GFR calc Af Amer: >60 mL/min (ref >60)
GFR calc non Af Amer: >60 mL/min (ref >60)
Glucose, Bld: 89 mg/dL (ref 65–99)
Potassium: 4.2 mmol/L (ref 3.5–5.1)
SODIUM: 137 mmol/L (ref 135–145)
Total Bilirubin: 0.6 mg/dL (ref 0.3–1.2)
Total Protein: 7.5 g/dL (ref 6.5–8.1)

## 2014-10-29 LAB — URINE DRUG SCREEN, QUALITATIVE (ARMC ONLY)
Amphetamines, Ur Screen: NOT DETECTED
BARBITURATES, UR SCREEN: NOT DETECTED
BENZODIAZEPINE, UR SCRN: NOT DETECTED
CANNABINOID 50 NG, UR ~~LOC~~: NOT DETECTED
Cocaine Metabolite,Ur ~~LOC~~: NOT DETECTED
MDMA (ECSTASY) UR SCREEN: NOT DETECTED
Methadone Scn, Ur: NOT DETECTED
Opiate, Ur Screen: NOT DETECTED
Phencyclidine (PCP) Ur S: NOT DETECTED
TRICYCLIC, UR SCREEN: NOT DETECTED

## 2014-10-29 LAB — CBC
HCT: 41.3 % (ref 35.0–47.0)
Hemoglobin: 13.9 g/dL (ref 12.0–16.0)
MCH: 30.3 pg (ref 26.0–34.0)
MCHC: 33.5 g/dL (ref 32.0–36.0)
MCV: 90.4 fL (ref 80.0–100.0)
PLATELETS: 261 10*3/uL (ref 150–440)
RBC: 4.57 MIL/uL (ref 3.80–5.20)
RDW: 14 % (ref 11.5–14.5)
WBC: 16 10*3/uL — ABNORMAL HIGH (ref 3.6–11.0)

## 2014-10-29 LAB — URINALYSIS COMPLETE WITH MICROSCOPIC (ARMC ONLY)
BILIRUBIN URINE: NEGATIVE
Bacteria, UA: NONE SEEN
GLUCOSE, UA: NEGATIVE mg/dL
KETONES UR: NEGATIVE mg/dL
Leukocytes, UA: NEGATIVE
NITRITE: NEGATIVE
Protein, ur: NEGATIVE mg/dL
SPECIFIC GRAVITY, URINE: 1.014 (ref 1.005–1.030)
pH: 6 (ref 5.0–8.0)

## 2014-10-29 LAB — ACETAMINOPHEN LEVEL: Acetaminophen (Tylenol), Serum: <10 ug/mL — ABNORMAL LOW (ref 10–30)

## 2014-10-29 LAB — SALICYLATE LEVEL: Salicylate Lvl: <4 mg/dL (ref 2.8–30.0)

## 2014-10-29 LAB — ETHANOL: Alcohol, Ethyl (B): <5 mg/dL (ref <5)

## 2014-10-29 MED ORDER — TETANUS-DIPHTHERIA TOXOIDS TD 5-2 LFU IM INJ
0.5000 mL | INJECTION | Freq: Once | INTRAMUSCULAR | Status: AC
  Administered 2014-10-29: 0.5 mL via INTRAMUSCULAR
  Filled 2014-10-29: qty 0.5

## 2014-10-29 NOTE — ED Provider Notes (Signed)
[facility] Emergency Department Provider Note  ____________________________________________  Time seen: Approximately 930 PM  I have reviewed the triage vital signs and the nursing notes.   HISTORY  Chief Complaint Depression    HPI [name] is a 39 y.o. female with a history of depression who is presenting today with increased depression and anxiety. She says that she has been having worsening depression and anxiety over the course the week. She says that she is having some family issues which are contributing to this. Because of this she cut her left wrist and arm today with a razor. She is unsure of the time of her last tetanus shot.She says that "she wouldn't mind if she had died." She denies any homicidal ideation or hallucinations.   Past Medical History  Diagnosis Date  . Mental disorder   . Depression     Patient Active Problem List   Diagnosis Date Noted  . Cluster B personality disorder 09/06/2014  . Bipolar affective disorder (HCC) 09/06/2011    Past Surgical History  Procedure Laterality Date  . Cholecystectomy    . Abdominal hysterectomy      Current Outpatient Rx  Name  Route  Sig  Dispense  Refill  . albuterol (PROVENTIL HFA;VENTOLIN HFA) 108 (90 BASE) MCG/ACT inhaler   Inhalation   Inhale 2 puffs into the lungs every 6 (six) hours as needed. For shortness of breath         . EXPIRED: divalproex (DEPAKOTE ER) 500 MG 24 hr tablet   Oral   Take 1 tablet (500 mg total) by mouth daily. For mood   30 tablet   0   . gabapentin (NEURONTIN) 600 MG tablet      Take 1/2 tablet twice ( ) a day and 1 tablet ( ) at bedtime for anxiety/pain/discomfort   60 tablet   0   . ibuprofen (ADVIL,MOTRIN) 200 MG tablet   Oral   Take 400 mg by mouth every 6 (six) hours as needed. For pain         . lamoTRIgine (LAMICTAL) 100 MG tablet   Oral   Take 1 tablet (100 mg total) by mouth daily. For mood   30 tablet   0   .  EXPIRED: risperiDONE (RISPERDAL) 1 MG tablet   Oral   Take 1 tablet (1 mg total) by mouth 2 (two) times daily. For thoughts/mood   60 tablet   0   . traZODone (DESYREL) 100 MG tablet   Oral   Take 1 tablet (100 mg total) by mouth at bedtime as needed for sleep.   30 tablet   0   . EXPIRED: venlafaxine XR (EFFEXOR-XR) 150 MG 24 hr capsule   Oral   Take 1 capsule (150 mg total) by mouth daily with breakfast. For depression/anxiety   30 capsule   0     Allergies Review of patient's allergies indicates no known allergies.  No family history on file.  Social History Social History  Substance Use Topics  . Smoking status: Current Every Day Smoker -- 1.00 packs/day for 16 years  . Smokeless tobacco: None     Comment: given info about patches and 2 wk free supply after discharge  . Alcohol Use: Yes     Comment: 48 oz beer qod    Review of Systems Constitutional: No fever/chills Eyes: No visual changes. ENT: No sore throat. Cardiovascular: Denies chest pain. Respiratory: Denies shortness of breath. Gastrointestinal: No abdominal pain.  No nausea, no  vomiting.  No diarrhea.  No constipation. Genitourinary: Negative for dysuria. Musculoskeletal: Negative for back pain. Skin: Negative for rash. Neurological: Negative for headaches, focal weakness or numbness.  10-point ROS otherwise negative.  ____________________________________________   PHYSICAL EXAM:  VITAL SIGNS: ED Triage Vitals  Enc Vitals Group     BP 10/29/14 2101 128/89 mmHg     Pulse Rate 10/29/14 2101 70     Resp 10/29/14 2101 19     Temp 10/29/14 2101 97.8 F (36.6 C)     Temp Source 10/29/14 2101 Oral     SpO2 10/29/14 2101 95 %     Weight 10/29/14 2101 200 lb (90.719 kg)     Height 10/29/14 2101  (1.702 m)     Head Cir --      Peak Flow --      Pain Score 10/29/14 2101 5     Pain Loc --      Pain Edu? --      Excl. in GC? --     Constitutional: Alert and oriented. Well appearing and in  no acute distress. Eyes: Conjunctivae are normal. PERRL. EOMI. Head: Atraumatic. Nose: No congestion/rhinnorhea. Mouth/Throat: Mucous membranes are moist.  Oropharynx non-erythematous. Neck: No stridor.   Cardiovascular: Normal rate, regular rhythm. Grossly normal heart sounds.  Good peripheral circulation. Respiratory: Normal respiratory effort.  No retractions. Lungs CTAB. Gastrointestinal: Soft and nontender. No distention. No abdominal bruits. No CVA tenderness. Musculoskeletal: No lower extremity tenderness nor edema.  No joint effusions. Neurologic:  Normal speech and language. No gross focal neurologic deficits are appreciated. No gait instability. Skin:  Skin is warm. No rash noted. Multiple superficial lacerations to left upper extremity which are about 1 mm in width and several centimeters in length. There is no active bleeding, surrounding induration or pus. Psychiatric: Flattened affect. Speech and behavior are normal.  ____________________________________________   LABS (all labs ordered are listed, but only abnormal results are displayed)  Labs Reviewed  ACETAMINOPHEN LEVEL - Abnormal; Notable for the following:    Acetaminophen (Tylenol), Serum <10 (*)    All other components within normal limits  CBC - Abnormal; Notable for the following:    WBC 16.0 (*)    All other components within normal limits  URINALYSIS COMPLETEWITH MICROSCOPIC ([facility] ONLY) - Abnormal; Notable for the following:    Color, Urine YELLOW (*)    APPearance CLEAR (*)    Hgb urine dipstick 2+ (*)    Squamous Epithelial / LPF 0-5 (*)    All other components within normal limits  COMPREHENSIVE METABOLIC PANEL  ETHANOL  SALICYLATE LEVEL  URINE DRUG SCREEN, QUALITATIVE ([facility] ONLY)    ____________________________________________  EKG   ____________________________________________  RADIOLOGY   ____________________________________________   PROCEDURES    ____________________________________________   INITIAL IMPRESSION / ASSESSMENT AND PLAN / ED COURSE  Pertinent labs & imaging results that were available during my care of the patient were reviewed by me and considered in my medical decision making (see chart for details).  We'll complete involuntary commitment paperwork for this patient. The patient is aware of the plan for the involuntary commitment. She has a white blood cell count of 16 but no focal finding on her history such as dysuria or or cough. It may be that her white count is reflective of the trauma inflicted to her left upper extremity. She also appears to have a chronically elevated white count back on her lab results over time.   ____________________________________________   FINAL  CLINICAL IMPRESSION(S) / ED DIAGNOSES  Self-inflicted lacerations to the left upper extremity. Suicidal ideation    Myrna Blazeravid Matthew Lyfe Reihl, MD 10/29/14 [phone]

## 2014-10-29 NOTE — ED Notes (Signed)
Handoff report given to Beth, RN.

## 2014-10-29 NOTE — ED Notes (Signed)
Pt brought into ED BHU via sally port by ED Tech Mayra and [employer] officer. Wanded with [employer] detector for safety by [employer]er Dixon. Patient oriented to unit/care area: Pt informed of unit policies and procedures.  Informed that, for their safety, care areas are designed for safety and monitored by security cameras at all times; and visiting hours explained to patient. Patient verbalizes understanding, and verbal contract for safety obtained.Pt shown to their room-#1.   BEHAVIORAL HEALTH ROUNDING Patient sleeping: No. Patient alert and oriented: yes Behavior appropriate: Yes.   Nutrition and fluids offered: Yes  Toileting and hygiene offered: Yes  Sitter present: q15 min observations and security camera monitoring Law enforcement present: Yes [employer]  ENVIRONMENTAL ASSESSMENT Potentially harmful objects out of patient reach: Yes.   Personal belongings secured: Yes.   Patient dressed in hospital provided attire only: Yes.   Plastic bags out of patient reach: Yes.   Patient care equipment removed: Yes.   Equipment and supplies removed: Yes.   Potentially toxic materials out of patient reach: Yes.   Sharps container removed or out of patient reach: Yes.

## 2014-10-29 NOTE — ED Notes (Signed)
Pt dressed out per Mayra, EDT and Raynelle FanningJulie, EDT. [facility] to [facility].

## 2014-10-29 NOTE — ED Notes (Signed)
Warm blanket given per ED Tech.

## 2014-10-29 NOTE — BH Assessment (Signed)
Assessment Note  Shelia HopesMargaret Terry is an 39 y.o. female presenting to the ED under IVC for worsening depression and self-inflicted cut marks on her arm.  Pt reports she has been feeling depressed and stressed out over her son's medical condition.  Pt states she cut herself but she didn't really want to kill herself.  Pt denies any HI, auditory or visual hallucinations.    Pt reports symptoms of depression such as crying, insomnia, loss of appetite and feelings of helplessness and hopelessness.  Pt reports she is currently being treated for depression by Dr. Marguerite [facility]ett at [facility].    Diagnosis: Depression  Past Medical History:  Past Medical History  Diagnosis Date  . Mental disorder   . Depression     Past Surgical History  Procedure Laterality Date  . Cholecystectomy    . Abdominal hysterectomy      Family History: No family history on file.  Social History:  reports that she has been smoking.  She does not have any smokeless tobacco history on file. She reports that she drinks alcohol. She reports that she does not use illicit drugs.  Additional Social History:  Alcohol / Drug Use History of alcohol / drug use?: No history of alcohol / drug abuse  CIWA: CIWA-Ar BP: 128/89 mmHg Pulse Rate: 70 COWS:    Allergies: No Known Allergies  Home Medications:  (Not in a hospital admission)  OB/GYN Status:  No LMP recorded. Patient has had a hysterectomy.  General Assessment Data [facility] Assessment: In system Is this a Tele or Face-to-Face Assessment?: Face-to-Face Is this an Initial Assessment or a Re-assessment for this encounter?: Initial Assessment Marital status: Married [place]A Is patient pregnant?: No Pregnancy Status: No Living Arrangements: Spouse/significant other Can pt return to current living arrangement?: Yes Admission Status: Involuntary Is patient capable of signing voluntary admission?: No Referral Source:  Self/Family/Friend Insurance type: None  Medical Screening Exam [facility] Walk-in ONLY) Medical Exam completed: Yes  Crisis Care Plan Living Arrangements: Spouse/significant other Name of Psychiatrist: Dr. Marguerite OleaMoffett Name of Therapist: [facility]  Education Status Is patient currently in school?: No Current Grade: N/A Highest grade of school patient has completed: N/A Name of school: N/A Contact person: N/A  Risk to self with the past 6 months Suicidal Ideation: Yes-Currently Present Has patient been a risk to self within the past 6 months prior to admission? : Yes Suicidal Intent: Yes-Currently Present Has patient had any suicidal intent within the past 6 months prior to admission? : Yes Is patient at risk for suicide?: Yes Suicidal Plan?: Yes-Currently Present Has patient had any suicidal plan within the past 6 months prior to admission? : Yes Specify Current Suicidal Plan: Pt cut her wrists. Access to Means: Yes Specify Access to Suicidal Means: Pt has access to razors. What has been your use of drugs/alcohol within the last 12 months?: None reported Previous Attempts/Gestures: Yes How many times?: 1 Other Self Harm Risks: None reported Triggers for Past Attempts: Family contact Intentional Self Injurious Behavior: Cutting Comment - Self Injurious Behavior: Pt has superficial cuts on her wrists. Family Suicide History: No Recent stressful life event(s): Other (Comment) (Health issues) Persecutory voices/beliefs?: No Depression: Yes Depression Symptoms: Tearfulness, Loss of interest in usual pleasures, Feeling worthless/self pity Substance abuse history and/or treatment for substance abuse?: No Suicide prevention information given to non-admitted patients: Not applicable  Risk to Others within the past 6 months Homicidal Ideation: No Does patient have any lifetime risk of  violence toward others beyond the six months prior to admission? : No Thoughts of Harm to Others: No Current  Homicidal Intent: No Current Homicidal Plan: No Access to Homicidal Means: No Identified Victim: N/A History of harm to others?: No Assessment of Violence: None Noted Violent Behavior Description: N/A Does patient have access to weapons?: No Criminal Charges Pending?: No Does patient have a court date: No Is patient on probation?: No  Psychosis Hallucinations: None noted Delusions: None noted  Mental Status Report Appearance/Hygiene: In scrubs Eye Contact: Fair Motor Activity: Unremarkable Speech: Soft, Incoherent Level of Consciousness: Drowsy Mood: Depressed, Sad Affect: Depressed, Sad Anxiety Level: Minimal Thought Processes: Coherent Judgement: Partial Orientation: Person, Place, Time, Situation Obsessive Compulsive Thoughts/Behaviors: None  Cognitive Functioning Concentration: Normal Memory: Recent Intact IQ: Average Insight: Poor Impulse Control: Poor Appetite: Fair Weight Loss: 0 Weight Gain: 0 Sleep: No Change Total Hours of Sleep: 5 Vegetative Symptoms: None  ADLScreening [facility] Assessment Services) Patient's cognitive ability adequate to safely complete daily activities?: Yes Patient able to express need for assistance with ADLs?: Yes Independently performs ADLs?: Yes (appropriate for developmental age)  Prior Inpatient Therapy Prior Inpatient Therapy: Yes Prior Therapy Dates: 09/11/13 Prior Therapy Facilty/Provider(s): [facility] Reason for Treatment: depression  Prior Outpatient Therapy Prior Outpatient Therapy: Yes Prior Therapy Dates: current Prior Therapy Facilty/Provider(s): [facility] Reason for Treatment: depression Does patient have an ACCT team?: No Does patient have Intensive In-House Services?  : No Does patient have [facility] services? : Unknown Does patient have P4CC services?: No  ADL Screening (condition at time of admission) Patient's cognitive ability adequate to safely complete daily activities?: Yes Patient able to express need for  assistance with ADLs?: Yes Independently performs ADLs?: Yes (appropriate for developmental age)       Abuse/Neglect Assessment (Assessment to be complete while patient is alone) Physical Abuse: Denies Verbal Abuse: Denies Sexual Abuse: Denies Exploitation of patient/patient's resources: Denies Self-Neglect: Denies Values / Beliefs Cultural Requests During Hospitalization: None Spiritual Requests During Hospitalization: None Consults Spiritual Care Consult Needed: No Social Work Consult Needed: No Merchant navy officer (For Healthcare) Does patient have an advance directive?: No Would patient like information on creating an advanced directive?: No - patient declined information    Additional Information 1:1 In Past 12 Months?: No CIRT Risk: No Elopement Risk: No Does patient have medical clearance?: Yes     Disposition:  Disposition Initial Assessment Completed for this Encounter: Yes Disposition of Patient: Other dispositions Other disposition(s): Other (Comment) (Psych MD consult)  On Site Evaluation by:   Reviewed with Physician:    Manus Rudd Raeana Blinn 10/29/2014 11:15 PM

## 2014-10-29 NOTE — ED Notes (Signed)
Pt presents to ED via EMS from personal home with c/o of depression and self-inflicted cutting marks to left arm. EMS states pt called EMS and has been experiencing depressive thoughts for approximately x1 month, self-inflicted cutting marks beginning this evening. Marks are superficial extending from wrist to upper arm. Pt alert and oriented, tearful and ambulatory to treatment room. Bleeding controlled to affected areas. Pt states self-inflicted wounds, in attempt to hurt self. Pt denies HI, auditory and visual hallucinations.

## 2014-10-30 ENCOUNTER — Inpatient Hospital Stay
Admission: EM | Admit: 2014-10-30 | Discharge: 2014-11-03 | DRG: 885 | Disposition: A | Discharge status: Home / Self Care | Payer: No Typology Code available for payment source | Source: Intra-hospital | Attending: Psychiatry | Admitting: Psychiatry

## 2014-10-30 DIAGNOSIS — R45851 Suicidal ideations: Secondary | ICD-10-CM | POA: Diagnosis present

## 2014-10-30 DIAGNOSIS — F1010 Alcohol abuse, uncomplicated: Secondary | ICD-10-CM

## 2014-10-30 DIAGNOSIS — F17210 Nicotine dependence, cigarettes, uncomplicated: Secondary | ICD-10-CM | POA: Diagnosis present

## 2014-10-30 DIAGNOSIS — F332 Major depressive disorder, recurrent severe without psychotic features: Secondary | ICD-10-CM

## 2014-10-30 DIAGNOSIS — Z9049 Acquired absence of other specified parts of digestive tract: Secondary | ICD-10-CM | POA: Diagnosis not present

## 2014-10-30 DIAGNOSIS — G4700 Insomnia, unspecified: Secondary | ICD-10-CM | POA: Diagnosis present

## 2014-10-30 DIAGNOSIS — Z90710 Acquired absence of both cervix and uterus: Secondary | ICD-10-CM

## 2014-10-30 DIAGNOSIS — F3341 Major depressive disorder, recurrent, in partial remission: Secondary | ICD-10-CM

## 2014-10-30 DIAGNOSIS — F419 Anxiety disorder, unspecified: Secondary | ICD-10-CM | POA: Diagnosis present

## 2014-10-30 DIAGNOSIS — F1090 Alcohol use, unspecified, uncomplicated: Secondary | ICD-10-CM

## 2014-10-30 MED ORDER — VENLAFAXINE HCL ER 75 MG PO CP24: 225.0000 mg | ORAL_CAPSULE | Freq: Every day | ORAL | Status: DC

## 2014-10-30 MED ORDER — BUPROPION HCL ER (SR) 150 MG PO TB12
150.0000 mg | ORAL_TABLET | Freq: Two times a day (BID) | ORAL | Status: DC
  Administered 2014-10-30 – 2014-11-01 (×4): 150 mg via ORAL
  Filled 2014-10-30 (×4): qty 1

## 2014-10-30 MED ORDER — GABAPENTIN 300 MG PO CAPS
300.0000 mg | ORAL_CAPSULE | Freq: Two times a day (BID) | ORAL | Status: DC
  Administered 2014-10-30 – 2014-11-03 (×8): 300 mg via ORAL
  Filled 2014-10-30 (×8): qty 1

## 2014-10-30 MED ORDER — BUPROPION HCL ER (SR) 150 MG PO TB12
150.0000 mg | ORAL_TABLET | Freq: Two times a day (BID) | ORAL | Status: DC
  Administered 2014-10-30: 150 mg via ORAL
  Filled 2014-10-30 (×2): qty 1

## 2014-10-30 MED ORDER — ACETAMINOPHEN 325 MG PO TABS: 650.0000 mg | ORAL_TABLET | Freq: Four times a day (QID) | ORAL | Status: DC | PRN

## 2014-10-30 MED ORDER — ARIPIPRAZOLE 10 MG PO TABS
10.0000 mg | ORAL_TABLET | Freq: Every day | ORAL | Status: DC
  Administered 2014-10-31: 10 mg via ORAL
  Filled 2014-10-30: qty 1

## 2014-10-30 MED ORDER — GABAPENTIN 300 MG PO CAPS
300.0000 mg | ORAL_CAPSULE | Freq: Two times a day (BID) | ORAL | Status: DC
  Administered 2014-10-30: 300 mg via ORAL
  Filled 2014-10-30: qty 1

## 2014-10-30 MED ORDER — VENLAFAXINE HCL ER 75 MG PO CP24
225.0000 mg | ORAL_CAPSULE | Freq: Every day | ORAL | Status: DC
  Administered 2014-10-31 – 2014-11-03 (×4): 225 mg via ORAL
  Filled 2014-10-30 (×4): qty 3

## 2014-10-30 MED ORDER — ALUM & MAG HYDROXIDE-SIMETH 200-200-20 MG/5ML PO SUSP: 30.0000 mL | ORAL | Status: DC | PRN

## 2014-10-30 MED ORDER — MAGNESIUM HYDROXIDE 400 MG/5ML PO SUSP: 30.0000 mL | Freq: Every day | ORAL | Status: DC | PRN

## 2014-10-30 MED ORDER — ARIPIPRAZOLE 10 MG PO TABS
10.0000 mg | ORAL_TABLET | Freq: Every day | ORAL | Status: DC
  Administered 2014-10-30: 10 mg via ORAL
  Filled 2014-10-30: qty 1

## 2014-10-30 NOTE — ED Notes (Signed)
BEHAVIORAL HEALTH ROUNDING Patient sleeping: Yes.   Patient alert and oriented: not applicable Behavior appropriate: Yes.    Nutrition and fluids offered: No Toileting and hygiene offered: No Sitter present: q15 minute observations and security camera monitoring Law enforcement present: Yes [employer] 

## 2014-10-30 NOTE — ED Notes (Signed)
Pt in room. No complaints or concerns voiced at this time. No abnormal behavior noted at this time. Will continue to monitor with q15 min checks and security camera monitoring. ODS officer in area. 

## 2014-10-30 NOTE — ED Notes (Signed)
Pt resting comfortably in bed with lights turned off, no increased work in breathing noted. Pt verbally acknowledged to contract for safety. NAD noted at this time. Will continue to monitor patient.

## 2014-10-30 NOTE — ED Notes (Signed)
ED BHU PLACEMENT JUSTIFICATION Is the patient under IVC or is there intent for IVC: Yes Is the patient medically cleared: Yes.   Is there vacancy in the ED BHU: Yes.   Is the population mix appropriate for patient: Yes.   Is the patient awaiting placement in inpatient or outpatient setting: Yes.   Has the patient had a psychiatric consult: Yes.   Survey of unit performed for contraband, proper placement and condition of furniture, tampering with fixtures in bathroom, shower, and each patient room: Yes.  ; Findings: None APPEARANCE/BEHAVIOR Calm and cooperative NEURO ASSESSMENT Orientation: time, place and person Hallucinations: No.None noted (Hallucinations) Speech: Normal Gait: normal RESPIRATORY ASSESSMENT Normal expansion.  Clear to auscultation.  No rales, rhonchi, or wheezing. CARDIOVASCULAR ASSESSMENT regular rate and rhythm, S1, S2 normal, no murmur, click, rub or gallop GASTROINTESTINAL ASSESSMENT soft, nontender, BS WNL, no r/g EXTREMITIES normal strength, tone, and muscle mass PLAN OF CARE Provide calm/safe environment. Vital signs assessed twice daily. ED BHU Assessment once each 12-hour shift. Collaborate with intake RN daily or as condition indicates. Assure the ED provider has rounded once each shift. Provide and encourage hygiene. Provide redirection as needed. Assess for escalating behavior; address immediately and inform ED provider.  Assess family dynamic and appropriateness for visitation as needed: Yes.  ; If necessary, describe findings: none Educate the patient/family about BHU procedures/visitation: Yes.  ; If necessary, describe findings: none

## 2014-10-30 NOTE — ED Notes (Signed)
Pt in room., pt sleeping at this time. No complaints or concerns voiced at this time. No abnormal behavior noted at this time. Will continue to monitor with q15 min checks and security camera monitoring. ODS officer in area. 

## 2014-10-30 NOTE — ED Notes (Signed)
Pt resting comfortably in bed with lights turned off, TV on in room. No increased respirations noted. Pt denies any pain. Will continue to monitor.

## 2014-10-30 NOTE — Progress Notes (Signed)
PTA Pending: Pt do not have a list and the dosage  MEDICATION [place]MT. [place] [id number] Telephone # [phone]

## 2014-10-30 NOTE — Consult Note (Signed)
[facility] [facility] Consult   Reason for Consult:  Consult for this 39 year old woman with a history of recurrent depression comes into the hospital after cutting herself Referring Physician:  Cinda Quest Patient Identification: Glynn Freas MRN:  [id number] Principal Diagnosis: Severe recurrent major depression without psychotic features [facility]) Diagnosis:   Patient Active Problem List   Diagnosis Date Noted  . Severe recurrent major depression without psychotic features ([place]) [F33.2] 10/30/2014  . Alcohol abuse [F10.10] 10/30/2014  . 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 Bipolar affective disorder ([place]) [F31.9] 09/06/2011    Total Time spent with patient: 1 hour  Subjective:   [name] is a 39 y.o. female patient admitted with "I'm better than yesterday but I've been feeling really bad.".  HPI:  Information from the patient and the chart. Patient interviewed. Chart reviewed. Old notes reviewed. Laboratory results reviewed. This 39 year old woman called the crisis line through [facility] yesterday and they sent EMS out to pick her up. She was expressing suicidal thoughts and had cut herself multiple times. She tells me that she has been feeling very down and depressed worse than usual and it has been going on for at least a month. She feels very negative about herself. Says that she feels like a loser and doesn't feel like she can do anything right. Her energy level has been poor. She is sleeping poorly and waking up multiple times at night. She has had an increased appetite. She has had suicidal thoughts but says that so far she had felt that she couldn't bring herself to actually do it although when she cut herself yesterday she had some fantasies about possibly dying. She denies having any auditory or visual hallucinations. She has been taking her Effexor and Wellbutrin but ran out of Abilify a month ago and was told that she couldn't get it refilled at medication  management. She has not been back to see her psychiatrist in about 3 months. She has increased her alcohol intake in the last month. Denies that she's using any other drugs. One major stress is that her 73 year-old son seems to have a very serious medical problem with some kind of growth on his spinal cord that is obviously frightening to the patient.  Social history: Patient lives by herself. Shares custody of her 3 children with her ex-husband. She says they spend most of their time with him it sounds like he has more resources. She is working in a pretty low level of food services. Doesn't have any insurance. Doesn't have a vehicle or any way around. Sounds like she is dependent on her ex-husband for transportation.  Medical history: Patient denies any medical history area in the past she was given albuterol but she is not complaining of shortness of breath currently.  Substance abuse history: Patient says that she will often increase her drinking to deal with her stress. Recently she's been drinking more. It's unclear if it's been every day or if it's just been several days but with increased amounts. Denies that she's using any other drugs.  Current medication: Wellbutrin 300 mg a day divided and Effexor XR 225 mg a day. Gabapentin restarted and she is taking it sort of as a when necessary.  Past Psychiatric History: Patient has a history of recurrent episodes of depression. She has been diagnosed with bipolar in the past. Doesn't have a clear history of manic episodes with psychosis. Has been treated mostly with antidepressants although she says the addition of Abilify more  recently was very helpful. She stopped taking it when she was told that she couldn't get it through medication management anymore. Does have a past history of overdose but she is vague about whether it was actually a suicide attempt.  Risk to Self: Suicidal Ideation: Yes-Currently Present Suicidal Intent: Yes-Currently  Present Is patient at risk for suicide?: Yes Suicidal Plan?: Yes-Currently Present Specify Current Suicidal Plan: Pt cut her wrists. Access to Means: Yes Specify Access to Suicidal Means: Pt has access to razors. What has been your use of drugs/alcohol within the last 12 months?: None reported How many times?: 1 Other Self Harm Risks: None reported Triggers for Past Attempts: Family contact Intentional Self Injurious Behavior: Cutting Comment - Self Injurious Behavior: Pt has superficial cuts on her wrists. Risk to Others: Homicidal Ideation: No Thoughts of Harm to Others: No Current Homicidal Intent: No Current Homicidal Plan: No Access to Homicidal Means: No Identified Victim: N/A History of harm to others?: No Assessment of Violence: None Noted Violent Behavior Description: N/A Does patient have access to weapons?: No Criminal Charges Pending?: No Does patient have a court date: No Prior Inpatient Therapy: Prior Inpatient Therapy: Yes Prior Therapy Dates: 09/11/13 Prior Therapy Facilty/Provider(s): [facility] Reason for Treatment: depression Prior Outpatient Therapy: Prior Outpatient Therapy: Yes Prior Therapy Dates: current Prior Therapy Facilty/Provider(s): [facility] Reason for Treatment: depression Does patient have an ACCT team?: No Does patient have Intensive In-House Services?  : No Does patient have [facility] services? : Unknown Does patient have P4CC services?: No  Past Medical History:  Past Medical History  Diagnosis Date  . Mental disorder   . Depression     Past Surgical History  Procedure Laterality Date  . Cholecystectomy    . Abdominal hysterectomy     Family History: No family history on file. Family Psychiatric  History: Denies having any family history at all of mental health or substance abuse problems. Social History:  History  Alcohol Use  . Yes    Comment: 48 oz beer qod     History  Drug Use No    Social History   Social History  . Marital  Status: Married    Spouse Name: N/A  . Number of Children: N/A  . Years of Education: N/A   Social History Main Topics  . Smoking status: Current Every Day Smoker -- 1.00 packs/day for 16 years  . Smokeless tobacco: None     Comment: given info about patches and 2 wk free supply after discharge  . Alcohol Use: Yes     Comment: 48 oz beer qod  . Drug Use: No  . Sexual Activity: Yes    Birth Control/ Protection: Surgical   Other Topics Concern  . None   Social History Narrative   Additional Social History:    History of alcohol / drug use?: No history of alcohol / drug abuse                     Allergies:  No Known Allergies  Labs:  Results for orders placed or performed during the hospital encounter of 10/29/14 (from the past 48 hour(s))  Comprehensive metabolic panel     Status: None   Collection Time: 10/29/14  9:07 PM  Result Value Ref Range   Sodium 137 135 - 145 mmol/L   Potassium 4.2 3.5 - 5.1 mmol/L   Chloride 102 101 - 111 mmol/L   CO2 29 22 - 32 mmol/L   Glucose, Bld 89  65 - 99 mg/dL   BUN 8 6 - 20 mg/dL   Creatinine, Ser 0.79 0.44 - 1.00 mg/dL   Calcium 9.0 8.9 - 10.3 mg/dL   Total Protein 7.5 6.5 - 8.1 g/dL   Albumin 3.9 3.5 - 5.0 g/dL   AST 21 15 - 41 U/L   ALT 21 14 - 54 U/L   Alkaline Phosphatase 72 38 - 126 U/L   Total Bilirubin 0.6 0.3 - 1.2 mg/dL   GFR calc non Af Amer >60 >60 mL/min   GFR calc Af Amer >60 >60 mL/min    Comment: (NOTE) The eGFR has been calculated using the CKD EPI equation. This calculation has not been validated in all clinical situations. eGFR's persistently <60 mL/min signify possible Chronic Kidney Disease.    Anion gap 6 5 - 15  Ethanol (ETOH)     Status: None   Collection Time: 10/29/14  9:07 PM  Result Value Ref Range   Alcohol, Ethyl (B) <5 <5 mg/dL    Comment:        LOWEST DETECTABLE LIMIT FOR SERUM ALCOHOL IS 5 mg/dL FOR MEDICAL PURPOSES ONLY   Salicylate level     Status: None   Collection Time:  10/29/14  9:07 PM  Result Value Ref Range   Salicylate Lvl <9.3 2.8 - 30.0 mg/dL  Acetaminophen level     Status: Abnormal   Collection Time: 10/29/14  9:07 PM  Result Value Ref Range   Acetaminophen (Tylenol), Serum <10 (L) 10 - 30 ug/mL    Comment:        THERAPEUTIC CONCENTRATIONS VARY SIGNIFICANTLY. A RANGE OF 10-30 ug/mL MAY BE AN EFFECTIVE CONCENTRATION FOR MANY PATIENTS. HOWEVER, SOME ARE BEST TREATED AT CONCENTRATIONS OUTSIDE THIS RANGE. ACETAMINOPHEN CONCENTRATIONS >150 ug/mL AT 4 HOURS AFTER INGESTION AND >50 ug/mL AT 12 HOURS AFTER INGESTION ARE OFTEN ASSOCIATED WITH TOXIC REACTIONS.   CBC     Status: Abnormal   Collection Time: 10/29/14  9:07 PM  Result Value Ref Range   WBC 16.0 (H) 3.6 - 11.0 K/uL   RBC 4.57 3.80 - 5.20 MIL/uL   Hemoglobin 13.9 12.0 - 16.0 g/dL   HCT 41.3 35.0 - 47.0 %   MCV 90.4 80.0 - 100.0 fL   MCH 30.3 26.0 - 34.0 pg   MCHC 33.5 32.0 - 36.0 g/dL   RDW 14.0 11.5 - 14.5 %   Platelets 261 150 - 440 K/uL  Urine Drug Screen, Qualitative ([facility] only)     Status: None   Collection Time: 10/29/14  9:07 PM  Result Value Ref Range   Tricyclic, Ur Screen NONE DETECTED NONE DETECTED   Amphetamines, Ur Screen NONE DETECTED NONE DETECTED   MDMA (Ecstasy)Ur Screen NONE DETECTED NONE DETECTED   Cocaine Metabolite,Ur [place] NONE DETECTED NONE DETECTED   Opiate, Ur Screen NONE DETECTED NONE DETECTED   Phencyclidine (PCP) Ur S NONE DETECTED NONE DETECTED   Cannabinoid 50 Ng, Ur [place] NONE DETECTED NONE DETECTED   Barbiturates, Ur Screen NONE DETECTED NONE DETECTED   Benzodiazepine, Ur Scrn NONE DETECTED NONE DETECTED   Methadone Scn, Ur NONE DETECTED NONE DETECTED    Comment: (NOTE) 810  Tricyclics, urine               Cutoff 1000 ng/mL 200  Amphetamines, urine             Cutoff 1000 ng/mL 300  MDMA (Ecstasy), urine           Cutoff 500 ng/mL  400  Cocaine Metabolite, urine       Cutoff 300 ng/mL 500  Opiate, urine                   Cutoff 300 ng/mL 600   Phencyclidine (PCP), urine      Cutoff 25 ng/mL 700  Cannabinoid, urine              Cutoff 50 ng/mL 800  Barbiturates, urine             Cutoff 200 ng/mL 900  Benzodiazepine, urine           Cutoff 200 ng/mL 1000 Methadone, urine                Cutoff 300 ng/mL 1100 1200 The urine drug screen provides only a preliminary, unconfirmed 1300 analytical test result and should not be used for non-medical 1400 purposes. Clinical consideration and professional judgment should 1500 be applied to any positive drug screen result due to possible 1600 interfering substances. A more specific alternate chemical method 1700 must be used in order to obtain a confirmed analytical result.  1800 Gas chromato graphy / mass spectrometry (GC/MS) is the preferred 1900 confirmatory method.   Urinalysis complete, with microscopic ([facility] only)     Status: Abnormal   Collection Time: 10/29/14  9:07 PM  Result Value Ref Range   Color, Urine YELLOW (A) YELLOW   APPearance CLEAR (A) CLEAR   Glucose, UA NEGATIVE NEGATIVE mg/dL   Bilirubin Urine NEGATIVE NEGATIVE   Ketones, ur NEGATIVE NEGATIVE mg/dL   Specific Gravity, Urine 1.014 1.005 - 1.030   Hgb urine dipstick 2+ (A) NEGATIVE   pH 6.0 5.0 - 8.0   Protein, ur NEGATIVE NEGATIVE mg/dL   Nitrite NEGATIVE NEGATIVE   Leukocytes, UA NEGATIVE NEGATIVE   RBC / HPF 6-30 0 - 5 RBC/hpf   WBC, UA 0-5 0 - 5 WBC/hpf   Bacteria, UA NONE SEEN NONE SEEN   Squamous Epithelial / LPF 0-5 (A) NONE SEEN   Mucous PRESENT     Current Facility-Administered Medications  Medication Dose Route Frequency Provider Last Rate Last Dose  . ARIPiprazole (ABILIFY) tablet 10 mg  10 mg Oral Daily Gonzella Lex, MD      . [facility] SR) 12 hr tablet 150 mg  150 mg Oral BID Gonzella Lex, MD      . gabapentin (NEURONTIN) capsule 300 mg  300 mg Oral BID Gonzella Lex, MD      . Derrill Memo ON 10/31/2014] venlafaxine XR (EFFEXOR-XR) 24 hr capsule 225 mg  225 mg Oral Q breakfast Gonzella Lex, MD       Current Outpatient Prescriptions  Medication Sig Dispense Refill  . albuterol (PROVENTIL HFA;VENTOLIN HFA) 108 (90 BASE) MCG/ACT inhaler Inhale 2 puffs into the lungs every 6 (six) hours as needed. For shortness of breath    . divalproex (DEPAKOTE ER) 500 MG 24 hr tablet Take 1 tablet (500 mg total) by mouth daily. For mood 30 tablet 0  . gabapentin (NEURONTIN) 600 MG tablet Take 1/2 tablet twice (348m) a day and 1 tablet (6013m at bedtime for anxiety/pain/discomfort 60 tablet 0  . ibuprofen (ADVIL,MOTRIN) 200 MG tablet Take 400 mg by mouth every 6 (six) hours as needed. For pain    . lamoTRIgine (LAMICTAL) 100 MG tablet Take 1 tablet (100 mg total) by mouth daily. For mood 30 tablet 0  . risperiDONE (RISPERDAL) 1 MG tablet Take 1 tablet (  1 mg total) by mouth 2 (two) times daily. For thoughts/mood 60 tablet 0  . traZODone (DESYREL) 100 MG tablet Take 1 tablet (100 mg total) by mouth at bedtime as needed for sleep. 30 tablet 0  . venlafaxine XR (EFFEXOR-XR) 150 MG 24 hr capsule Take 1 capsule (150 mg total) by mouth daily with breakfast. For depression/anxiety 30 capsule 0    Musculoskeletal: Strength & Muscle Tone: within normal limits Gait & Station: normal Patient leans: N/A  Psychiatric Specialty Exam: Review of Systems  Constitutional: Positive for malaise/fatigue.  HENT: Negative.   Eyes: Negative.   Respiratory: Negative.   Cardiovascular: Negative.   Gastrointestinal: Negative.   Musculoskeletal: Negative.   Skin: Negative.   Neurological: Negative.   Psychiatric/Behavioral: Positive for depression, suicidal ideas and substance abuse. Negative for hallucinations and memory loss. The patient is nervous/anxious and has insomnia.     Blood pressure 111/68, pulse 80, temperature 97.7 F (36.5 C), temperature source Oral, resp. rate 18, height 5' 7"  (1.702 m), weight 90.719 kg (200 lb), SpO2 99 %.Body mass index is 31.32 kg/(m^2).  General Appearance:  Disheveled  Eye Contact::  Minimal  Speech:  Slow  Volume:  Decreased  Mood:  Depressed  Affect:  Depressed  Thought Process:  Goal Directed  Orientation:  Full (Time, Place, and Person)  Thought Content:  Negative  Suicidal Thoughts:  Yes.  without intent/plan  Homicidal Thoughts:  No  Memory:  Immediate;   Fair Recent;   Fair Remote;   Fair  Judgement:  Impaired  Insight:  Fair  Psychomotor Activity:  Decreased  Concentration:  Fair  Recall:  [employer] of Knowledge:Fair  Language: Fair  Akathisia:  No  Handed:  Right  AIMS (if indicated):     Assets:  Communication Skills Desire for Improvement Housing Physical Health Resilience  ADL's:  Intact  Cognition: WNL  Sleep:      Treatment Plan Summary: Daily contact with patient to assess and evaluate symptoms and progress in treatment, Medication management and Plan Patient continues to look very sad and depressed. Has a clear history of recurrent depression with impulsive acts that are potentially dangerous. Minimal social support. She admits that although she was not frankly trying to kill her self that the idea was clearly in her mind when she cut herself yesterday. All things considered I think the safe and appropriate thing is to admit her to the hospital. She is willing to do this and understands the rationale. For her mental health depression problems I will continue her Wellbutrin and Effexor and gabapentin and restart her on Abilify 10 mg per day. I called the medication management clinic to ask about the supply of Abilify and was told that while they had gone through. When it was not available they have now located a new program and so the patient will be able to continue to get Abilify through medication management at this time. Therefore its reasonable to go ahead and restarted as she should be able to afford it again. As far as her alcohol abuse I don't think she needs alcohol withdrawal treatment however we will keep an  eye on that and the patient should be encouraged to discontinue alcohol use in the future. 15 minute checks in place. Labs including lipid panel and hemoglobin A1c to be checked due to Abilify.  Disposition: Recommend psychiatric Inpatient admission when medically cleared.  Xiomara Sevillano 10/30/2014 12:01 PM

## 2014-10-30 NOTE — ED Notes (Signed)
[facility] Charity fundraiserN. This RN assumed patient care.

## 2014-10-30 NOTE — B[facility] Counselor (Signed)
[facility] by Dr. Toni Amendlapacs. Attending Physician will be Dr. Asencion PartridgeA. Willams.  Pt. has been [facility] to room 325, [facility] Charge Nurse Jerry CarasWendy S..  Intake Paper Work has been signed and placed on pt. chart. ER staff Elder Love([place], ER Sect.; Dr. Cyril LoosenKinner, ER MD; [facility] Patient's Nurse & Bradly Chrisene Patient Access) have been made aware of the admission.

## 2014-10-30 NOTE — ED Notes (Signed)

## 2014-10-30 NOTE — ED Notes (Signed)
[facility] with [facility], RN, okay [facility]

## 2014-10-30 NOTE — ED Provider Notes (Signed)
-----------------------------------------   7:09 AM on 10/30/2014 -----------------------------------------   Blood pressure 128/89, pulse 70, temperature 97.8 F (36.6 C), temperature source Oral, resp. rate 19, height 5\' 7"  (1.702 m), weight 200 lb (90.719 kg), SpO2 95 %.  The patient had no acute events since last update.  Calm and cooperative at this time.  Disposition is pending per Psychiatry/Behavioral Medicine team recommendations.     Sharman CheekPhillip Jeorge Reister, MD 10/30/14 [phone]

## 2014-10-31 DIAGNOSIS — F332 Major depressive disorder, recurrent severe without psychotic features: Principal | ICD-10-CM

## 2014-10-31 LAB — LIPID PANEL
CHOLESTEROL: 283 mg/dL — AB (ref 0–200)
HDL: 52 mg/dL (ref >40)
LDL Cholesterol: 199 mg/dL — ABNORMAL HIGH (ref 0–99)
TRIGLYCERIDES: 160 mg/dL — AB (ref <150)
Total CHOL/HDL Ratio: 5.4 RATIO
VLDL: 32 mg/dL (ref 0–40)

## 2014-10-31 LAB — TSH: TSH: 0.425 u[IU]/mL (ref 0.350–4.500)

## 2014-10-31 LAB — HEMOGLOBIN A1C: HEMOGLOBIN A1C: 5.4 % (ref 4.0–6.0)

## 2014-10-31 MED ORDER — TRAZODONE HCL 50 MG PO TABS
50.0000 mg | ORAL_TABLET | Freq: Every evening | ORAL | Status: DC | PRN
  Administered 2014-11-01 – 2014-11-02 (×2): 50 mg via ORAL
  Filled 2014-10-31 (×2): qty 1

## 2014-10-31 MED ORDER — ARIPIPRAZOLE 10 MG PO TABS
10.0000 mg | ORAL_TABLET | Freq: Every day | ORAL | Status: DC
  Administered 2014-11-01 – 2014-11-03 (×3): 10 mg via ORAL
  Filled 2014-10-31 (×3): qty 1

## 2014-10-31 NOTE — Tx Team (Signed)
Initial Interdisciplinary Treatment Plan   PATIENT STRESSORS: Educational concerns Financial difficulties Health problems   PATIENT STRENGTHS: Ability for insight Average or above average intelligence Capable of independent living Motivation for treatment/growth Supportive family/friends   PROBLEM LIST: Problem List/Patient Goals Date to be addressed Date deferred Reason deferred Estimated date of resolution  Suicidal Ideations 10/30/2014     SIB/Cluster BPD 10/30/2014     Depressive Symptoms 10/30/2014                                          DISCHARGE CRITERIA:  Ability to meet basic life and health needs Improved stabilization in mood, thinking, and/or behavior Motivation to continue treatment in a less acute level of care Verbal commitment to aftercare and medication compliance  PRELIMINARY DISCHARGE PLAN: Outpatient therapy Return to previous living arrangement  PATIENT/FAMIILY INVOLVEMENT: This treatment plan has been presented to and reviewed with the patient, [name], and/or family member.  The patient and family have been given the opportunity to ask questions and make suggestions.  [name] 10/31/2014, 2:50 AM

## 2014-10-31 NOTE — H&P (Addendum)
Psychiatric Admission Assessment Adult  Patient Identification: [name] MRN:  [id number] Date of Evaluation:  10/31/2014 Chief Complaint:  major depression "depression" and "worthless" Principal Diagnosis: Major depression, recurrent, severe without psychotic features Diagnosis:  Alcohol use disorder, moderate Patient Active Problem List   Diagnosis Date Noted  . Severe recurrent major depression without psychotic features (HCC) [F33.2] 10/30/2014  . Alcohol abuse [F10.10] 10/30/2014  . Major depression (HCC) [F32.9] 10/30/2014  . 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 Bipolar affective disorder (HCC) [F31.9] 09/06/2011   History of Present Illness: Patient indicates that her depression has worsened 41 over the past month. She describes several social stressors. She states that she is under some stress with her ex-husband. She states that he occasionally helps her with transportation but then also makes her feel bad about being dependent on him. She states that her 41 year old son has a mass in his spinal cord and they are simply going through assessing it through radiology techniques. She states that they do not know a specific diagnosis yet. She states that she feels sometimes her kids feel like she is not good enough for them.  She endorses difficulty sleeping, anhedonia, feelings of guilt, low energy, poor concentration, increased appetite and suicidal ideation. She states she is not to the point of feeling like she would act on it. She did cut herself on 10/29/2014. However she states that she engaged in cutting for emotional relief and not with a desire to die. She states she has cut herself in the past for emotional relief but a medication Abilify did help her with this and she really had not cut very often over the past year being on Abilify. She indicated that she might of cut twice during that year. However she relates that she began to have some difficulties with  getting medication coverage for Abilify and thus she had been off of it for several months.  She denies any auditory or visualizations. She denies any manic symptoms but stated that probably in 2010 she was more confident and perhaps engage in risky sexual behavior. Associated Signs/Symptoms: Depression Symptoms:  depressed mood, anhedonia, insomnia, feelings of worthlessness/guilt, difficulty concentrating, increased appetite, Patient engaged in self-injurious behavior prior to this admission but states it was cutting for emotional relief. (Hypo) Manic Symptoms:  Denies Anxiety Symptoms:  States she does have increased anxiety and states that she drinks to calm herself. Psychotic Symptoms:  None PTSD Symptoms: Negative Total Time spent with patient: 1 hour  Past Psychiatric History: Patient indicates she's had 3 prior admissions. She states that 2 other prior admissions were at this facility and one of them was in [place]. She states that one month ago she overdosed on Effexor, was monitored in the emergency room and then sent home. She states that she overdosed on Effexor 1 month ago and was in the emergency room and then released. She has engaged in self interest behavior which she states started when she was 39, however she states over the past year while she was on Abilify it decreased to 2 times during that year. She cut on 10/29/2014 prior to this admission.  Risk to Self: Is patient at risk for suicide?: Yes Risk to Others:   Prior Inpatient Therapy:   Prior Outpatient Therapy:    Alcohol Screening: Patient refused Alcohol Screening Tool: Yes 1. How often do you have a drink containing alcohol?: 2 to 4 times a month 2. How many drinks containing alcohol do you have on a  typical day when you are drinking?: 3 or 4 3. How often do you have six or more drinks on one occasion?: Never Preliminary Score: 1 4. How often during the last year have you found that you were not able to  stop drinking once you had started?: Never 5. How often during the last year have you failed to do what was normally expected from you becasue of drinking?: Never 6. How often during the last year have you needed a first drink in the morning to get yourself going after a heavy drinking session?: Never 7. How often during the last year have you had a feeling of guilt of remorse after drinking?: Less than monthly 8. How often during the last year have you been unable to remember what happened the night before because you had been drinking?: Never 9. Have you or someone else been injured as a result of your drinking?: No 10. Has a relative or friend or a doctor or another health worker been concerned about your drinking or suggested you cut down?: No Alcohol Use Disorder Identification Test Final Score (AUDIT): 4 Brief Intervention: AUDIT score less than 7 or less-screening does not suggest unhealthy drinking-brief intervention not indicated Substance Abuse History in the last 12 months:  Yes.   Consequences of Substance Abuse: Negative Previous Psychotropic Medications: Yes  Psychological Evaluations: Yes  Past Medical History:  Past Medical History  Diagnosis Date  . Mental disorder   . Depression     Past Surgical History  Procedure Laterality Date  . Cholecystectomy    . Abdominal hysterectomy     Family History: History reviewed. No pertinent family history. Family Psychiatric  History: Patient denies any knowledge of family history of psychiatric illness Social History: Patient has 3 children ages 28, 74 and 4. She is divorced. She completed community college and then one year of college however she states that she was partying and did not get good grades. She currently works in Warden/ranger at [employer] and has done so for the past year. History  Alcohol Use  . Yes    Comment: 48 oz beer qod     History  Drug Use No    Social History   Social History  . Marital  Status: Married    Spouse Name: N/A  . Number of Children: N/A  . Years of Education: N/A   Social History Main Topics  . Smoking status: Current Every Day Smoker -- 1.00 packs/day for 16 years  . Smokeless tobacco: None     Comment: given info about patches and 2 wk free supply after discharge  . Alcohol Use: Yes     Comment: 48 oz beer qod  . Drug Use: No  . Sexual Activity: Yes    Birth Control/ Protection: Surgical   Other Topics Concern  . None   Social History Narrative   Additional Social History:                         Allergies:  No Known Allergies Lab Results:  Results for orders placed or performed during the hospital encounter of 10/30/14 (from the past 48 hour(s))  Lipid panel, fasting     Status: Abnormal   Collection Time: 10/31/14  6:09 AM  Result Value Ref Range   Cholesterol 283 (H) 0 - 200 mg/dL   Triglycerides 161 (H) <150 mg/dL   HDL 52 >09 mg/dL   Total CHOL/HDL Ratio 5.4  RATIO   VLDL 32 0 - 40 mg/dL   LDL Cholesterol 161199 (H) 0 - 99 mg/dL    Comment:        Total Cholesterol/HDL:CHD Risk Coronary Heart Disease Risk Table                     Men   Women  1/2 Average Risk   3.4   3.3  Average Risk       5.0   4.4  2 X Average Risk   9.6   7.1  3 X Average Risk  23.4   11.0        Use the calculated Patient Ratio above and the CHD Risk Table to determine the patient's CHD Risk.        ATP III CLASSIFICATION (LDL):  <100     mg/dL   Optimal  [id number]  mg/dL   Near or Above                    Optimal  130-159  mg/dL   Borderline  [id number]  mg/dL   High  >914>190     mg/dL   Very High   TSH     Status: None   Collection Time: 10/31/14  6:09 AM  Result Value Ref Range   TSH 0.425 0.350 - 4.500 uIU/mL    Metabolic Disorder Labs:  No results found for: HGBA1C, MPG No results found for: PROLACTIN Lab Results  Component Value Date   CHOL 283* 10/31/2014   TRIG 160* 10/31/2014   HDL 52 10/31/2014   CHOLHDL 5.4 10/31/2014   VLDL 32  10/31/2014   LDLCALC 199* 10/31/2014   LDLCALC 213* 03/17/2013    Current Medications: Current Facility-Administered Medications  Medication Dose Route Frequency Provider Last Rate Last Dose  . acetaminophen (TYLENOL) tablet 650 mg  650 mg Oral Q6H PRN Audery AmelJohn T Clapacs, MD      . alum & mag hydroxide-simeth (MAALOX/MYLANTA) 200-200-20 MG/5ML suspension 30 mL  30 mL Oral Q4H PRN Audery AmelJohn T Clapacs, MD      . ARIPiprazole (ABILIFY) tablet 10 mg  10 mg Oral Daily Audery AmelJohn T Clapacs, MD   10 mg at 10/31/14 1030  . buPROPion [facility](WELLBUTRIN SR) 12 hr tablet 150 mg  150 mg Oral BID Audery AmelJohn T Clapacs, MD   150 mg at 10/31/14 1030  . gabapentin (NEURONTIN) capsule 300 mg  300 mg Oral BID Audery AmelJohn T Clapacs, MD   300 mg at 10/31/14 1030  . magnesium hydroxide (MILK OF MAGNESIA) suspension 30 mL  30 mL Oral Daily PRN Audery AmelJohn T Clapacs, MD      . venlafaxine XR (EFFEXOR-XR) 24 hr capsule 225 mg  225 mg Oral Q breakfast Audery AmelJohn T Clapacs, MD   225 mg at 10/31/14 1029   PTA Medications: No prescriptions prior to admission    Musculoskeletal: Strength & Muscle Tone: within normal limits Gait & Station: normal Patient leans: N/A  Psychiatric Specialty Exam: Physical Exam  Review of Systems  Psychiatric/Behavioral: Positive for depression and substance abuse. Negative for suicidal ideas, hallucinations and memory loss. The patient is nervous/anxious and has insomnia.     Blood pressure 114/76, pulse 81, temperature 98.2 F (36.8 C), temperature source Oral, resp. rate 18, height 5\' 7"  (1.702 m), weight 92.534 kg (204 lb), SpO2 99 %.Body mass index is 31.94 kg/(m^2).  General Appearance: Disheveled  Eye Contact::  Fair  Speech:  Normal Rate  Volume:  Normal  Mood:  Depressed  Affect:  Congruent  Thought Process:  Linear  Orientation:  Full (Time, Place, and Person)  Thought Content:  Negative  Suicidal Thoughts:  No  Homicidal Thoughts:  No  Memory:  Immediate;   Good Recent;   Good Remote;   Good  Judgement:   Impaired  Insight:  Fair  Psychomotor Activity:  Negative  Concentration:  Good  Recall:  Good  Fund of Knowledge:Fair  Language: Good  Akathisia:  Negative  Handed:    AIMS (if indicated):   done today, normal, no dentures or partials  Assets:  Desire for Improvement Vocational/Educational  ADL's:  Intact  Cognition: WNL  Sleep:  Number of Hours: 7     Treatment Plan Summary: Daily contact with patient to assess and evaluate symptoms and progress in treatment, Medication management and Plan   Major depressive disorder, recurrent, severe without psychotic features-she will continue on her Effexor XR to 25 mg daily, Wellbutrin SR 1-50 mg daily. Patient initially expressed an interest in trying Rexulti, as she reports the Abilify would cause her some daytime sedation even though she states it helped with her mood stability. However writer checked in our pharmacy does not stop Rexulti. I explained this to patient indicated that the other medication for augmenting depression could be Seroquel. However she states she would prefer just continue on her Abilify.   Alcohol use disorder-we will monitor patient for any signs of withdrawal although her use pattern would not suggests she would have any withdrawal problems. She should engage in counseling as she reports using this to treat anxiety.   Insomnia-trazodone Observation Level/Precautions:  Continuous Observation  Laboratory:  Reviewed  Psychotherapy:    Medications:    Consultations:    Discharge Concerns:    Estimated LOS:  Other:     I certify that inpatient services furnished can reasonably be expected to improve the patient's condition.   Wallace Going 10/28/201611:46 AM

## 2014-10-31 NOTE — Progress Notes (Signed)
Arrived to unit in the custody of Law Enforcement accompanied by hospital nursing staffs, [name] is a 39 yo Caucasian [facility] for MDD Severe/Recurrent w/o PF, h/o Bipolar d/o, Cluster B Personality d/o, IVC by her relative, [name]. A&Ox4. Patient called the [facility] after multiple self-inflicting cuts to left arm and left wrist with a razor blade. Her depression get worse when she found out that her 41 yo son, [name], have some kind of growth on his Spinal Cord. Currently monitored for Depression by [name] of [facility]. Attempted suicide a month ago by Intentional Drug Overdose on her medications and was [facility] and released after considered safe; lives alone, currently in a mutually separated relationship from husband and a shared custody arrangements of her 3 (17yo Son, [name] and a daughter, [name], 41 yo) children. Employed at [employer], [employer]. Helpless, hopeless, worthless, a feeling of burdensomeness and exaggerated sense of guilt; "I am a failure, I make bad decision all the time; cutting is better than Physical and Emotional pains (has been cutting in the past 10 years); smokes 1ppd/21 pack years, drinks alcohol occasionally. UDS Negative, no illicit drug use, BAL<5 mg/dL.  Patient searched by 2 female nursing staffs on admission for contrabands to ensure a safe and therapeutic milieu for all, no paraphilia or any other contrabands found on patient or in the belongings. Skin Assessment completed and documented.

## 2014-10-31 NOTE — Progress Notes (Signed)
Denies SI.  Verbalizes that she cuts herself to make herself feel better it is not to kill herself.  Tearful and verbalizes feeling of helplessness and worthlessness.  Further states that she is just tired.  Patient also stated that she felt that she was not a good mother.  Asked her to tell me one thing that makes her not a good mother.  Patient unable to name one thing. Asked her patient if she makes sure she takes care of children.  Patient states that she does to best of her ability but she just feels like she can't provide enough.  Asked if she had given up on trying to improve on what she can do and patient states that she was and informed her that is a positive thing because she is striving to improve.

## 2014-10-31 NOTE — Plan of Care (Signed)
Problem: Ineffective individual coping Goal: LTG: Patient will report a decrease in negative feelings Outcome: Not Progressing Continues to endorse depression and feelings of not being a good mother and worthlessness.  tearful

## 2014-10-31 NOTE — Progress Notes (Signed)
Recreation Therapy Notes  INPATIENT RECREATION THERAPY ASSESSMENT  Patient Details Name: [name] MRN: [id number] DOB: 30-Jul-1975 Today's Date: 10/31/2014  Patient Stressors: Family, Relationship, Other (Comment) (Son hadbeen sick - was in the hospital, but now out of the hospital; separated for 1 year; no car)  Coping Skills:   Isolate, Arguments, Substance Abuse, Avoidance, Self-Injury, Art/Dance, Music, Sports, Other (Comment) (Watch TV)  Personal Challenges: Anger, Communication, Concentration, Decision-Making, Expressing Yourself, Problem-Solving, Relationships, Self-Esteem/Confidence, Social Interaction, Stress Management, Trusting Others  Leisure Interests (2+):  Individual - Other (Comment) (Hang out with kids)  Awareness of Community Resources:  Yes  Community Resources:  Park, Engineering ge[employer]  Current Use: Yes  If no, Barriers?:    Patient Strengths:  Caring, fun to be around  Patient Identified Areas of Improvement:  Communication, family life  Current Recreation Participation:  Nothing  Patient Goal for Hospitalization:  To have medication work, join therapy groups post d/c, not be so depressed  [place]idence[place]  County of Residence:  [place]   Current SI (including self-harm):  No  Current HI:  No  Consent to Intern Participation: N/A   Jacquelynn CreeGreene,[name], LRT/CTRS 10/31/2014, 2:43 PM

## 2014-10-31 NOTE — Progress Notes (Signed)
Patient is alert and oriented x 4, upon arrival to the unit skin check done by two registered nurses; skin is warm and dry and intact, tattoo noted to her lower back and right hand. Patient was also searched for contraband non found.

## 2014-10-31 NOTE — [facility] Group Notes (Signed)
[facility] LCSW Group Therapy  10/31/2014 4:38 PM  Type of Therapy:  Group Therapy  Participation Level:  Active  Participation Quality:  Appropriate and Attentive  Affect:  Appropriate  Cognitive:  Alert, Appropriate and Oriented  Insight:  Engaged  Engagement in Therapy:  Engaged  Modes of Intervention:  Discussion, Socialization and Support  Summary of Progress/Problems: Patient attended and participated in group discussion appropriately. Patient introduced herself and shared that she gets joy from "spending time with my kids". Patient shared that she could relate to other patient's using unhealthy coping skills and wants to start using healthier coping skills to manage her emotions.   Lulu RidingIngle, Geneieve Duell T, MSW, LCSWA  10/31/2014, 4:38 PM

## 2014-10-31 NOTE — Progress Notes (Signed)
Recreation Therapy Notes  Date: 10.28.16 Time: 3:00 pm Location: Craft Room  Group Topic: Communication, Problem Solving, Teamwork  Goal Area(s) Addresses:  Patient will effectively work with peers towards shared goal. Patient will identify skills used to make activity successful. Patient will identify benefit of using group skills effectively post d/c.  Behavioral Response: Did not attend  Intervention: Pipe Cleaner Tower  Activity: Patients were given 15 pipe cleanser and instructed to build the tallest free standing tower. Patients were given 2 minutes to strategize. After approximately 5 minutes of building, patients were instructed to put their dominant hand behind their back. After approximately 3 minutes of building, patients were instructed to stop talking to each other.    Education: LRT educated patients on how communication, problem solving, and teamwork goes in to building a healthy support system.  Education Outcome: Patient did not attend group.  Clinical Observations/Feedback: Patient did not attend group.  Sharis Keeran M, LRT/CTRS 10/31/2014 4:19 PM 

## 2014-10-31 NOTE — [facility] Suicide Risk Assessment (Signed)
[facility]   Nursing information obtained from:  Patient, Review of record Demographic factors:  Caucasian, Low socioeconomic status, Living alone Current Mental Status:  Suicidal ideation indicated by patient, Suicide plan, Self-harm thoughts, Self-harm behaviors, Intention to act on suicide plan, Belief that plan would result in death Loss Factors:  Decline in physical health, Financial problems / change in socioeconomic status 78(13 yo Son medical problem - Spinal Cord Growth - Malignant) Historical Factors:  Prior suicide attempts Risk Reduction Factors:  Responsible for children under 41 years of age Total Time spent with patient: 1 hour Principal Problem: <principal problem not specified> Diagnosis:   Patient Active Problem List   Diagnosis Date Noted  . Severe recurrent major depression without psychotic features (HCC) [F33.2] 10/30/2014  . Alcohol abuse [F10.10] 10/30/2014  . Major depression (HCC) [F32.9] 10/30/2014  . 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 Bipolar affective disorder (HCC) [F31.9] 09/06/2011     Continued Clinical Symptoms:  Alcohol Use Disorder Identification Test Final Score (AUDIT): 4 The "Alcohol Use Disorders Identification Test", Guidelines for Use in Primary Care, Second Edition.  World Science writerHealth Organization [facility](WHO). Score between 0-7:  no or low risk or alcohol related problems. Score between 8-15:  moderate risk of alcohol related problems. Score between 16-19:  high risk of alcohol related problems. Score 20 or above:  warrants further diagnostic evaluation for alcohol dependence and treatment.   CLINICAL FACTORS:   Severe Anxiety and/or Agitation Depression:   Comorbid alcohol abuse/dependence   Musculoskeletal: Strength & Muscle Tone: within normal limits Gait & Station: normal Patient leans: N/A  Psychiatric Specialty Exam: Physical Exam  ROS  Blood pressure 114/76, pulse 81, temperature 98.2 F  (36.8 C), temperature source Oral, resp. rate 18, height 5\' 7"  (1.702 m), weight 92.534 kg (204 lb), SpO2 99 %.Body mass index is 31.94 kg/(m^2).  See H and P from today.                                                       COGNITIVE FEATURES THAT CONTRIBUTE TO RISK:  None    SUICIDE RISK:   Mild:  Suicidal ideation of limited frequency, intensity, duration, and specificity.  There are no identifiable plans, no associated intent, mild dysphoria and related symptoms, good self-control (both objective and subjective assessment), few other risk factors, and identifiable protective factors, including available and accessible social support. Patient overdosed on Effexor 1 month ago and she does engage in self interest behavior as a form of emotional relief. She also abuses alcohol although not continuously. We will restart her Abilify to assist with depression and mood stabilization. Patient will also need to reconnect with therapy. She had been without her peers support since January of this year. We will do is to increase social supports and coping skills.  PLAN OF CARE:  Daily contact with patient to assess and evaluate symptoms and progress in treatment, Medication management and Plan   Major depressive disorder, recurrent, severe without psychotic features-she will continue on her Effexor XR to 25 mg daily, Wellbutrin SR 1-50 mg daily. Patient initially expressed an interest in trying Rexulti, as she reports the Abilify would cause her some daytime sedation even though she states it helped with her mood stability. However writer checked in our pharmacy does not stop Rexulti.  I explained this to patient indicated that the other medication for augmenting depression could be Seroquel. However she states she would prefer just continue on her Abilify.  Alcohol use disorder-we will monitor patient for any signs of withdrawal although her use pattern would not suggests she would  have any withdrawal problems. She should engage in counseling as she reports using this to treat anxiety. Medical Decision Making:  Established Problem, Worsening (2) and Review of Medication Regimen & Side Effects (2)  I certify that inpatient services furnished can reasonably be expected to improve the patient's condition.   Wallace Going 10/31/2014, 12:06 PM

## 2014-10-31 NOTE — [facility] Group Notes (Signed)
[facility] Group Notes:  (Nursing/MHT/Case Management/Adjunct)  Date:  10/31/2014  Time:  10:42 PM  Type of Therapy:  Evening Wrap-up Group  Participation Level:  Did Not Attend  Participation Quality:  Did Not Attend  Affect:  N/A  Cognitive:  N/A  Insight:  None  Engagement in Group:  None  Modes of Intervention:  Activity  Summary of Progress/Problems:  Tomasita MorrowChelsea Nanta Sherise Geerdes 10/31/2014, 10:42 PM

## 2014-11-01 MED ORDER — BUPROPION HCL ER (SR) 150 MG PO TB12
150.0000 mg | ORAL_TABLET | Freq: Two times a day (BID) | ORAL | Status: DC
  Administered 2014-11-02 – 2014-11-03 (×4): 150 mg via ORAL
  Filled 2014-11-01 (×4): qty 1

## 2014-11-01 NOTE — Plan of Care (Signed)
Problem: Spiritual Needs Goal: Ability to function at adequate level Outcome: Progressing Patient interactive and pleasant.   Problem: Consults [facility] Patient Education Outcome: Progressing Patient receptive to treatment.

## 2014-11-01 NOTE — Progress Notes (Signed)
[facility] MD Progress Note  11/01/2014 5:06 PM [name]  MRN:  [id number] Subjective:  Patient indicates that today she feels somewhat better. She states she feels like she has a little bit more energy today. She states that her appetite is good and she is sleeping well. She does somewhat look forward to discharge relating that she would like to see her son and this is a good sign. Principal Problem: <principal problem not specified> Diagnosis:   Patient Active Problem List   Diagnosis Date Noted  . Severe recurrent major depression without psychotic features (HCC) [F33.2] 10/30/2014  . Alcohol abuse [F10.10] 10/30/2014  . Major depression (HCC) [F32.9] 10/30/2014  . 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 Bipolar affective disorder (HCC) [F31.9] 09/06/2011   Total Time spent with patient: 20 minutes  Past Psychiatric History:   Past Medical History:  Past Medical History  Diagnosis Date  . Mental disorder   . Depression     Past Surgical History  Procedure Laterality Date  . Cholecystectomy    . Abdominal hysterectomy     Family History: History reviewed. No pertinent family history. Family Psychiatric  History:  Social History:  History  Alcohol Use  . Yes    Comment: 48 oz beer qod     History  Drug Use No    Social History   Social History  . Marital Status: Married    Spouse Name: N/A  . Number of Children: N/A  . Years of Education: N/A   Social History Main Topics  . Smoking status: Current Every Day Smoker -- 1.00 packs/day for 16 years  . Smokeless tobacco: None     Comment: given info about patches and 2 wk free supply after discharge  . Alcohol Use: Yes     Comment: 48 oz beer qod  . Drug Use: No  . Sexual Activity: Yes    Birth Control/ Protection: Surgical   Other Topics Concern  . None   Social History Narrative   Additional Social History:                         Sleep: Good  Appetite:  Good  Current  Medications: Current Facility-Administered Medications  Medication Dose Route Frequency Provider Last Rate Last Dose  . acetaminophen (TYLENOL) tablet 650 mg  650 mg Oral Q6H PRN Audery Amel, MD      . alum & mag hydroxide-simeth (MAALOX/MYLANTA) 200-200-20 MG/5ML suspension 30 mL  30 mL Oral Q4H PRN Audery Amel, MD      . ARIPiprazole (ABILIFY) tablet 10 mg  10 mg Oral Daily Kerin Salen, MD   10 mg at 11/01/14 0945  . buPROPion [facility] SR) 12 hr tablet 150 mg  150 mg Oral BID Audery Amel, MD   150 mg at 11/01/14 0945  . gabapentin (NEURONTIN) capsule 300 mg  300 mg Oral BID Audery Amel, MD   300 mg at 11/01/14 0945  . magnesium hydroxide (MILK OF MAGNESIA) suspension 30 mL  30 mL Oral Daily PRN Audery Amel, MD      . traZODone (DESYREL) tablet 50 mg  50 mg Oral QHS PRN Kerin Salen, MD      . venlafaxine XR (EFFEXOR-XR) 24 hr capsule 225 mg  225 mg Oral Q breakfast Audery Amel, MD   225 mg at 11/01/14 7846    Lab Results:  Results for orders placed or performed during  the hospital encounter of 10/30/14 (from the past 48 hour(s))  Hemoglobin A1c     Status: None   Collection Time: 10/31/14  6:09 AM  Result Value Ref Range   Hgb A1c MFr Bld 5.4 4.0 - 6.0 %  Lipid panel, fasting     Status: Abnormal   Collection Time: 10/31/14  6:09 AM  Result Value Ref Range   Cholesterol 283 (H) 0 - 200 mg/dL   Triglycerides 161160 (H) <150 mg/dL   HDL 52 >09>40 mg/dL   Total CHOL/HDL Ratio 5.4 RATIO   VLDL 32 0 - 40 mg/dL   LDL Cholesterol 604199 (H) 0 - 99 mg/dL    Comment:        Total Cholesterol/HDL:CHD Risk Coronary Heart Disease Risk Table                     Men   Women  1/2 Average Risk   3.4   3.3  Average Risk       5.0   4.4  2 X Average Risk   9.6   7.1  3 X Average Risk  23.4   11.0        Use the calculated Patient Ratio above and the CHD Risk Table to determine the patient's CHD Risk.        ATP III CLASSIFICATION (LDL):  <100     mg/dL   Optimal   540-981100-129  mg/dL   Near or Above                    Optimal  130-159  mg/dL   Borderline  191-478160-189  mg/dL   High  >295>190     mg/dL   Very High   TSH     Status: None   Collection Time: 10/31/14  6:09 AM  Result Value Ref Range   TSH 0.425 0.350 - 4.500 uIU/mL    Physical Findings: AIMS:  , ,  ,  ,    CIWA:  CIWA-Ar Total: 0 COWS:     Musculoskeletal: Strength & Muscle Tone: within normal limits Gait & Station: normal Patient leans: N/A  Psychiatric Specialty Exam: Review of Systems  Psychiatric/Behavioral: Positive for depression and substance abuse. Negative for suicidal ideas, hallucinations and memory loss. The patient has insomnia. The patient is not nervous/anxious.   All other systems reviewed and are negative.   Blood pressure 131/85, pulse 74, temperature 98 F (36.7 C), temperature source Oral, resp. rate 20, height 5\' 7"  (1.702 m), weight 92.534 kg (204 lb), SpO2 99 %.Body mass index is 31.94 kg/(m^2).  General Appearance: Well Groomed  Patent attorneyye Contact::  Good  Speech:  Normal Rate  Volume:  Normal  Mood:  Better  Affect:  Constricted  Thought Process:  Linear and Logical  Orientation:  Full (Time, Place, and Person)  Thought Content:  Negative  Suicidal Thoughts:  No  Homicidal Thoughts:  No  Memory:  Immediate;   Good Recent;   Good Remote;   Good  Judgement:  Good  Insight:  Fair  Psychomotor Activity:  Negative  Concentration:  Good  Recall:  Good  Fund of Knowledge:Good  Language: Good  Akathisia:  Negative  Handed:    AIMS (if indicated):     Assets:  Communication Skills Desire for Improvement Vocational/Educational  ADL's:  Intact  Cognition: WNL  Sleep:  Number of Hours: 8.75   Treatment Plan Summary: Daily contact with patient to assess and evaluate symptoms  and progress in treatment, Medication management and Plan   Major depressive disorder, recurrent, severe without psychotic features-she will continue on her Effexor XR 225 mg daily, and  Abilify 10 mg daily, Wellbutrin SR 150 mg daily.  She has been on this medication regimen with some benefit. However I discussed with her she's ever had a higher dose of Wellbutrin and we agreed we will try this to prove her depressed mood. Thus we'll increase Wellbutrin SR form 150 mg daily 150 Milligan twice daily.  Alcohol use disorder-we will monitor patient for any signs of withdrawal although her use pattern would not suggests she would have any withdrawal problems. She should engage in counseling as she reports using this to treat anxiety. Wallace Going 11/01/2014, 5:06 PM

## 2014-11-01 NOTE — [facility] Group Notes (Signed)
[facility] LCSW Group Therapy  11/01/2014 2:35 PM  Type of Therapy:  Group Therapy  Participation Level:  Minimal   Participation Quality:  Attentive  Affect: Flat  Cognitive:  Alert  Insight:  Limited  Engagement in Therapy:  Limited  Modes of Intervention:  Discussion, Education, Socialization and Support  Summary of Progress/Problems: Pt will identify unhealthy thoughts and how they impact their emotions and behavior. Pt will be encouraged to discuss these thoughts, emotions and behaviors with the group. Pt attended group and stayed the entire time. She sat quietly and listened to other group members share.   Saffron Busey L Traeger Sultana MSW, LCSWA  11/01/2014, 2:35 PM  

## 2014-11-01 NOTE — Progress Notes (Signed)
Pt has been pleasant and cooperative. Pt's mood and affect has been depressed. Pt denies SI and A/V hallucinations.Pt has been active on the unit. 

## 2014-11-02 DIAGNOSIS — F332 Major depressive disorder, recurrent severe without psychotic features: Secondary | ICD-10-CM

## 2014-11-02 NOTE — [facility] Group Notes (Signed)
[facility] LCSW Group Therapy  11/02/2014 6:45 PM  Type of Therapy:  Group Therapy  Participation Level:  Minimal  Participation Quality:  Attentive  Affect:  Depressed  Cognitive:  Alert  Insight:  Limited  Engagement in Therapy:  Limited  Modes of Intervention:  Discussion, Education, Socialization and Support  Summary of Progress/Problems:Todays topic: Grudges  Patients will be encouraged to discuss their thoughts, feelings, and behaviors as to why one holds on to grudges and reasons why people have grudges. Patients will process the impact of grudges on their daily lives and identify thoughts and feelings related to holding grudges. Patients will identify feelings and thoughts related to what life would look like without grudges. Pt attended group and stayed the entire time. She sat quietly and listened to other group members.   Lasaundra Riche L Chemeka Filice MSW, LCSWA  11/02/2014, 6:45 PM

## 2014-11-02 NOTE — Progress Notes (Signed)
[facility]  11/02/2014 1:46 PM Birdie HopesMargaret Holmer  MRN:  [id number] Subjective:  As was the case yesterday patient is requesting discharge. She states today she has found out from her ex-husband that the ex-husband is taking their son to the emergency room for medical care and she wants to be discharged today so she can go there. I indicated with do not discharge on the weekends. Patient is hopeful that she can get discharged tomorrow. She denies any problems with her medications. She does report her mood is better. Principal Problem: <principal problem not specified> Diagnosis:   Patient Active Problem List   Diagnosis Date Noted  . Severe recurrent major depression without psychotic features (HCC) [F33.2] 10/30/2014  . Alcohol abuse [F10.10] 10/30/2014  . Major depression (HCC) [F32.9] 10/30/2014  . 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 Bipolar affective disorder (HCC) [F31.9] 09/06/2011   Total Time spent with patient: 20 minutes  Past Psychiatric History:   Past Medical History:  Past Medical History  Diagnosis Date  . Mental disorder   . Depression     Past Surgical History  Procedure Laterality Date  . Cholecystectomy    . Abdominal hysterectomy     Family History: History reviewed. No pertinent family history. Family Psychiatric  History:  Social History:  History  Alcohol Use  . Yes    Comment: 48 oz beer qod     History  Drug Use No    Social History   Social History  . Marital Status: Married    Spouse Name: N/A  . Number of Children: N/A  . Years of Education: N/A   Social History Main Topics  . Smoking status: Current Every Day Smoker -- 1.00 packs/day for 16 years  . Smokeless tobacco: None     Comment: given info about patches and 2 wk free supply after discharge  . Alcohol Use: Yes     Comment: 48 oz beer qod  . Drug Use: No  . Sexual Activity: Yes    Birth Control/ Protection: Surgical   Other Topics Concern  . None    Social History Narrative   Additional Social History:                         Sleep: Good  Appetite:  Good  Current Medications: Current Facility-Administered Medications  Medication Dose Route Frequency Provider Last Rate Last Dose  . acetaminophen (TYLENOL) tablet 650 mg  650 mg Oral Q6H PRN Audery AmelJohn T Clapacs, MD      . alum & mag hydroxide-simeth (MAALOX/MYLANTA) 200-200-20 MG/5ML suspension 30 mL  30 mL Oral Q4H PRN Audery AmelJohn T Clapacs, MD      . ARIPiprazole (ABILIFY) tablet 10 mg  10 mg Oral Daily Kerin SalenAlton L Shean Gerding, MD   10 mg at 11/02/14 0912  . buPROPion (WELLBUTRIN SR) 12 hr tablet 150 mg  150 mg Oral BID Kerin SalenAlton L Mahmud Keithly, MD   150 mg at 11/02/14 0912  . gabapentin (NEURONTIN) capsule 300 mg  300 mg Oral BID Audery AmelJohn T Clapacs, MD   300 mg at 11/02/14 0912  . magnesium hydroxide (MILK OF MAGNESIA) suspension 30 mL  30 mL Oral Daily PRN Audery AmelJohn T Clapacs, MD      . traZODone (DESYREL) tablet 50 mg  50 mg Oral QHS PRN Kerin SalenAlton L Benedetta Sundstrom, MD   50 mg at 11/01/14 2220  . venlafaxine XR (EFFEXOR-XR) 24 hr capsule 225 mg  225 mg Oral Q  breakfast Audery Amel, MD   225 mg at 11/02/14 4098    Lab Results:  No results found for this or any previous visit (from the past 48 hour(s)).  Physical Findings: AIMS:  , ,  ,  ,    CIWA:  CIWA-Ar Total: 0 COWS:     Musculoskeletal: Strength & Muscle Tone: within normal limits Gait & Station: normal Patient leans: N/A  Psychiatric Specialty Exam: Review of Systems  Psychiatric/Behavioral: Positive for depression and substance abuse. Negative for suicidal ideas, hallucinations and memory loss. The patient has insomnia. The patient is not nervous/anxious.   All other systems reviewed and are negative.   Blood pressure 123/79, pulse 76, temperature 98.2 F (36.8 C), temperature source Oral, resp. rate 20, height  (1.702 m), weight 92.534 kg (204 lb), SpO2 99 %.Body mass index is 31.94 kg/(m^2).  General Appearance: Well Groomed  Proofreader::  Good  Speech:  Normal Rate  Volume:  Normal  Mood:  Better  Affect:  Constricted  Thought Process:  Linear and Logical  Orientation:  Full (Time, Place, and Person)  Thought Content:  Negative  Suicidal Thoughts:  No  Homicidal Thoughts:  No  Memory:  Immediate;   Good Recent;   Good Remote;   Good  Judgement:  Good  Insight:  Fair  Psychomotor Activity:  Negative  Concentration:  Good  Recall:  Good  Fund of Knowledge:Good  Language: Good  Akathisia:  Negative  Handed:    AIMS (if indicated):     Assets:  Communication Skills Desire for Improvement Vocational/Educational  ADL's:  Intact  Cognition: WNL  Sleep:  Number of Hours: 8.25   Treatment Plan Summary: Daily contact with patient to assess and evaluate symptoms and progress in treatment, Medication management and Plan   Major depressive disorder, recurrent, severe without psychotic features-she will continue on her Effexor XR 225 mg daily, and Abilify 10 mg daily, Wellbutrin SR 150 mg daily.  She has been on this medication regimen with some benefit. However I discussed with her she's ever had a higher dose of Wellbutrin and we agreed we will try this to prove her depressed mood. Thus we'll increase Wellbutrin SR form 150 mg daily 150 Milligan twice daily.  Alcohol use disorder-we will monitor patient for any signs of withdrawal although her use pattern would not suggests she would have any withdrawal problems. She should engage in counseling as she reports using this to treat anxiety. Wallace Going 11/02/2014, 1:46 PM

## 2014-11-02 NOTE — Plan of Care (Signed)
Problem: Spiritual Needs Goal: Ability to function at adequate level Outcome: Progressing Pt verbalizing feelings and functioning well.   Problem: Ineffective individual coping Goal: LTG: Patient will report a decrease in negative feelings Outcome: Progressing Patient states she feels better.  Goal: STG: Patient will remain free from self harm Outcome: Progressing No self harm.

## 2014-11-02 NOTE — Progress Notes (Signed)
Pt has been pleasant and cooperative. Pt's mood and affect has been les depressed. Pt denies SI and A/V hallucinations.Pt has been active on the unit.Pt states she feels much better.

## 2014-11-02 NOTE — [facility] Counselor (Signed)
Adult Comprehensive Assessment  Patient ID: [name], female   DOB: 12-May-1975, 39 y.o.   MRN: [id number]  Information Source: Information source: Patient  Current Stressors:  Educational / Learning stressors: None reported  Employment / Job issues: Pt is employed but states she would like to find a better job.  Family Relationships: Separated from husband, middle son is having major health issues.  Financial / Lack of resources (include bankruptcy): Limited income.  Housing / Lack of housing: None reported  Physical health (include injuries & life threatening diseases): None reported  Social relationships: None reported  Substance abuse: Denies recent use. Reports past opiate abuse.  Bereavement / Loss: Loss of relationship with husband.   Living/Environment/Situation:  Living Arrangements: Alone Living conditions (as described by patient or guardian): Good How long has patient lived in current situation?:  8 months  What is atmosphere in current home: Comfortable  Family History:  Marital status: Separated Separated, when?: 1 year ago.  What types of issues is patient dealing with in the relationship?: "We are just better off. I am not the easiet person to live with because of my depression and stuff"  Additional relationship information: Children live with her husband full time.  Does patient have children?: Yes How many children?: 3 How is patient's relationship with their children?: 3 sons ages 417, [id number]. Good relationship with the oldest and youngest sons. Strained relationship with the middle son.   Childhood History:  By whom was/is the patient raised?: Both parents Description of patient's relationship with caregiver when they were a child: "We werent as close as I wish we were."  Patient's description of current relationship with people who raised him/her: Close relationship with mother, father passed away in 2009.  Does patient have siblings?: No Did patient  suffer any verbal/emotional/physical/sexual abuse as a child?: No Did patient suffer from severe childhood neglect?: No Has patient ever been sexually abused/assaulted/raped as an adolescent or adult?: No Was the patient ever a victim of a crime or a disaster?: No Witnessed domestic violence?: No Has patient been effected by domestic violence as an adult?: No  Education:  Highest grade of school patient has completed: Tax adviserAssociate degree  Currently a student?: No Learning disability?: No  Employment/Work Situation:   Employment situation: Employed Where is patient currently employed?: [employer]  How long has patient been employed?: 1 year  Patient's job has been impacted by current illness: No What is the longest time patient has a held a job?: 10 years  Where was the patient employed at that time?: [employer]  Has patient ever been in the [employer] and Companymilitary?: No  Financial Resources:   Surveyor, quantityinancial resources: Income from employment Does patient have a representative payee or guardian?: No  Alcohol/Substance Abuse:   What has been your use of drugs/alcohol within the last 12 months?: None reported  Alcohol/Substance Abuse Treatment Hx: Denies past history Has alcohol/substance abuse ever caused legal problems?: No  Social Support System:   Conservation officer, natureatient's Community Support System: Good Describe Community Support System: family Type of faith/religion: Christianity  How does patient's faith help to cope with current illness?: Comfort   Leisure/Recreation:   Leisure and Hobbies: gardening, DIY crafts   Strengths/Needs:   What things does the patient do well?: talking to people, her job In what areas does patient struggle / problems for patient: self esteem, depression, son has major health issues, lack of coping skills.   Discharge Plan:   Does patient have access to transportation?: Yes Will  patient be returning to same living situation after discharge?: Yes Currently receiving community mental  health services: Yes (From Whom) ([facility]) Does patient have financial barriers related to discharge medications?: Yes Patient description of barriers related to discharge medications: No insurance. Limited income   Summary/Recommendations:   [name] is a 39 year old female who presented to [facility] with depression and SI. She reports cutting sporadically for the last 10 years. She reports thinking of cutting her wrists prior to admission. Pt is employed and lives alone in [name]. She reports the separation from her husband and her son's health issues as major stressors. She receives outpatient services at [facility]. She plans to return home and follow up with [facility]. Recommendations include; crisis stabilization, medication management, therapeutic milieu, and encourage group attendance and participation.   [name]. MSW, LCSWA  11/02/2014

## 2014-11-02 NOTE — [facility] Group Notes (Signed)
[facility] Group Notes:  (Nursing/MHT/Case Management/Adjunct)  Date:  11/02/2014  Time:  9:39 AM  Type of Therapy:  Goal-Setting  Participation Level:  Active  Participation Quality:  Appropriate and Sharing  Affect:  Appropriate  Cognitive:  Appropriate  Insight:  Good  Engagement in Group:  Engaged  Modes of Intervention:  Discussion  Summary of Progress/Problems:  Shelia CourseWhitney R Tiye Terry 11/02/2014, 9:39 AM

## 2014-11-03 MED ORDER — VENLAFAXINE HCL ER 75 MG PO CP24: 225.0000 mg | ORAL_CAPSULE | Freq: Every day | ORAL | Status: DC

## 2014-11-03 MED ORDER — ARIPIPRAZOLE 10 MG PO TABS: 10.0000 mg | ORAL_TABLET | Freq: Every day | ORAL | Status: DC

## 2014-11-03 MED ORDER — GABAPENTIN 300 MG PO CAPS: 300.0000 mg | ORAL_CAPSULE | Freq: Two times a day (BID) | ORAL | Status: DC

## 2014-11-03 MED ORDER — BUPROPION HCL ER (SR) 150 MG PO TB12: 150.0000 mg | ORAL_TABLET | Freq: Two times a day (BID) | ORAL | Status: DC

## 2014-11-03 NOTE — Discharge Summary (Signed)
Physician Discharge Summary Note  Patient:  [name] is an 39 y.o., female MRN:  [id number] DOB:  May 28, 1975 Patient phone:  [phone] (home)  Patient address:   [place] [address],  Total Time spent with patient: 30 minutes  Date of Admission:  10/30/2014 Date of Discharge: 11/03/14  Reason for Admission:  Worsening depression  Principal Problem: Severe episode of recurrent major depressive disorder, without psychotic features [facility]) Discharge Diagnoses: Patient Active Problem List   Diagnosis Date Noted  . Severe episode of recurrent major depressive disorder, without psychotic features ([place]) [F33.2]   . Severe recurrent major depression without psychotic features ([place]) [F33.2] 10/30/2014  . Alcohol abuse [F10.10] 10/30/2014  . 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 Bipolar affective disorder ([place]) [F31.9] 09/06/2011    Musculoskeletal: Strength & Muscle Tone: within normal limits Gait & Station: normal Patient leans: N/A  Psychiatric Specialty Exam: Physical Exam  Constitutional: She is oriented to person, place, and time. She appears well-developed and well-nourished.  HENT:  Head: Normocephalic and atraumatic.  Eyes: Conjunctivae and EOM are normal.  Neck: Normal range of motion.  Respiratory: Effort normal.  Musculoskeletal: Normal range of motion.  Neurological: She is alert and oriented to person, place, and time.  Skin: Skin is warm and dry.    Review of Systems  Constitutional: Negative.   HENT: Negative.   Eyes: Negative.   Respiratory: Negative.   Cardiovascular: Negative.   Gastrointestinal: Negative.   Genitourinary: Negative.   Musculoskeletal: Negative.   Skin: Negative.   Neurological: Negative.   Endo/Heme/Allergies: Negative.   Psychiatric/Behavioral: Negative.     Blood pressure 112/77, pulse 79, temperature 98.2 F (36.8 C), temperature source Oral, resp. rate 20, height _0  (1.702 m), weight 92.534  kg (204 lb), SpO2 99 %.Body mass index is 31.94 kg/(m^2).  General Appearance: Fairly Groomed  Engineer, water::  Good  Speech:  Clear and Coherent  Volume:  Normal  Mood:  Euthymic  Affect:  Appropriate and Congruent  Thought Process:  Linear  Orientation:  Full (Time, Place, and Person)  Thought Content:  Hallucinations: None  Suicidal Thoughts:  No  Homicidal Thoughts:  No  Memory:  Immediate;   Good Recent;   Good Remote;   Good  Judgement:  Good  Insight:  Good  Psychomotor Activity:  Normal  Concentration:  Good  Recall:  NA  Fund of Knowledge:Good  Language: Good  Akathisia:  No  Handed:    AIMS (if indicated):     Assets:  Communication Skills Desire for Improvement Housing Physical Health  ADL's:  Intact  Cognition: WNL  Sleep:  Number of Hours: 6.75    History of Present Illness: Patient indicates that her depression has worsened over the past month. She describes several social stressors. She states that she is under some stress with her ex-husband. She states that he occasionally helps her with transportation but then also makes her feel bad about being dependent on him. She states that her 57 year old son has a mass in his spinal cord and they are simply going through assessing it through radiology techniques. She states that they do not know a specific diagnosis yet. She states that she feels sometimes her kids feel like she is not good enough for them.  She endorses difficulty sleeping, anhedonia, feelings of guilt, low energy, poor concentration, increased appetite and suicidal ideation. She states she is not to the point of feeling like she would act on it. She did  cut herself on 10/29/2014. However she states that she engaged in cutting for emotional relief and not with a desire to die. She states she has cut herself in the past for emotional relief but a medication Abilify did help her with this and she really had not cut very often over the past year being on  Abilify. She indicated that she might of cut twice during that year. However she relates that she began to have some difficulties with getting medication coverage for Abilify and thus she had been off of it for several months.  She denies any auditory or visualizations. She denies any manic symptoms but stated that probably in 2010 she was more confident and perhaps engage in risky sexual behavior. Associated Signs/Symptoms: Depression Symptoms: depressed mood, anhedonia, insomnia, feelings of worthlessness/guilt, difficulty concentrating, increased appetite, Patient engaged in self-injurious behavior prior to this admission but states it was cutting for emotional relief. (Hypo) Manic Symptoms: Denies Anxiety Symptoms: States she does have increased anxiety and states that she drinks to calm herself. Psychotic Symptoms: None PTSD Symptoms: Negative Total Time spent with patient: 1 hour  Past Psychiatric History: Patient indicates she's had 3 prior admissions. She states that 2 other prior admissions were at this facility and one of them was in [place]. She states that one month ago she overdosed on Effexor, was monitored in the emergency room and then sent home. She states that she overdosed on Effexor 1 month ago and was in the emergency room and then released. She has engaged in self interest behavior which she states started when she was 66, however she states over the past year while she was on Abilify it decreased to 2 times during that year. She cut on 10/29/2014 prior to this admission.   Past Medical History:  Past Medical History  Diagnosis Date  . Mental disorder   . Depression     Past Surgical History  Procedure Laterality Date  . Cholecystectomy    . Abdominal hysterectomy     Family History: History reviewed. No pertinent family history. Family Psychiatric History: Patient denies any knowledge of family history of psychiatric illness Social  History: Patient has 3 children ages 42, 67 and 23. She is divorced. She completed community college and then one year of college however she states that she was partying and did not get good grades. 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 History  Alcohol Use  . Yes    Comment: 48 oz beer qod    History  Drug Use No    Social History   Social History  . Marital Status: Married    Spouse Name: N/A  . Number of Children: N/A  . Years of Education: N/A   Social History Main Topics  . Smoking status: Current Every Day Smoker -- 1.00 packs/day for 16 years  . Smokeless tobacco: None     Comment: given info about patches and 2 wk free supply after discharge  . Alcohol Use: Yes     Comment: 48 oz beer qod  . Drug Use: No  . Sexual Activity: Yes    Birth Control/ Protection: Surgical         Hospital Course:    Major depressive disorder, recurrent, severe without psychotic features-she will continue on her Effexor XR to 225 mg daily. Wellbutrin SR was increased to 150 mg bid.  Patient initially expressed an interest in trying [place], as she  reports the Abilify would cause her some daytime sedation even though she states it helped with her mood stability. However writer checked in our pharmacy does not stop Rexulti. I explained this to patient indicated that the other medication for augmenting depression could be Seroquel. However she states she would prefer just continue on her Abilify.  Alcohol use disorder: no evidence of withdrawal during hospital stay.  On the day of discharge patient reported feeling much improved.  She denied having thoughts of self harm, SI, HI or hallucinations.  Described mood as anxious as her son was admitted to [facility] over the weekend.  She explains that her 62 y/o son has a tumors inside his spinal cord.  He is now scheduled for a biopsy on Nov  2nd.   Patient was future oriented she plans to visit and stay with her son at the hospital. She plans to continue to f/u with [facility] were she receives group therapy and medication management.  This hospitalization was uneventful. Pt was calm and cooperative.  No need for seclusion, restraints or forced medications.  No behavioral issues were reported by staff.   Discharge Vitals:   Blood pressure 112/77, pulse 79, temperature 98.2 F (36.8 C), temperature source Oral, resp. rate 20, height _0  (1.702 m), weight 92.534 kg (204 lb), SpO2 99 %. Body mass index is 31.94 kg/(m^2).  Lab Results:    Results for ZELPHIA, GLOVER (MRN [id number]) as of 11/03/2014 11:34  Ref. Range 10/29/2014 21:07 10/31/2014 06:09  Sodium Latest Ref Range: 135-145 mmol/L 137   Potassium Latest Ref Range: 3.5-5.1 mmol/L 4.2   Chloride Latest Ref Range: 101-111 mmol/L 102   CO2 Latest Ref Range: 22-32 mmol/L 29   BUN Latest Ref Range: 6-20 mg/dL 8   Creatinine Latest Ref Range: 0.44-1.00 mg/dL 0.79   Calcium Latest Ref Range: 8.9-10.3 mg/dL 9.0   EGFR (Non-African Amer.) Latest Ref Range: >60 mL/min >60   EGFR (African American) Latest Ref Range: >60 mL/min >60   Glucose Latest Ref Range: 65-99 mg/dL 89   Anion gap Latest Ref Range: 5-15  6   Alkaline Phosphatase Latest Ref Range: 38-126 U/L 72   Albumin Latest Ref Range: 3.5-5.0 g/dL 3.9   AST Latest Ref Range: 15-41 U/L 21   ALT Latest Ref Range: 14-54 U/L 21   Total Protein Latest Ref Range: 6.5-8.1 g/dL 7.5   Total Bilirubin Latest Ref Range: 0.3-1.2 mg/dL 0.6   Cholesterol Latest Ref Range: 0-200 mg/dL  283 (H)  Triglycerides Latest Ref Range: <150 mg/dL  160 (H)  HDL Cholesterol Latest Ref Range: >40 mg/dL  52  LDL (calc) Latest Ref Range: 0-99 mg/dL  199 (H)  VLDL Latest Ref Range: 0-40 mg/dL  32  Total CHOL/HDL Ratio Latest Units: RATIO  5.4  WBC Latest Ref Range: 3.6-11.0 K/uL 16.0 (H)   RBC Latest Ref Range: 3.80-5.20 MIL/uL 4.57   Hemoglobin  Latest Ref Range: 12.0-16.0 g/dL 13.9   HCT Latest Ref Range: 35.0-47.0 % 41.3   MCV Latest Ref Range: 80.0-100.0 fL 90.4   MCH Latest Ref Range: 26.0-34.0 pg 30.3   MCHC Latest Ref Range: 32.0-36.0 g/dL 33.5   RDW Latest Ref Range: 11.5-14.5 % 14.0   Platelets Latest Ref Range: 741-287 K/uL 867   Salicylate Lvl Latest Ref Range: 2.8-30.0 mg/dL <4.0   Acetaminophen Latest Ref Range: 10-30 ug/mL <10 (L)   Hemoglobin A1C Latest Ref Range: 4.0-6.0 %  5.4  TSH Latest Ref Range: 0.350-4.500 uIU/mL  0.425  Appearance Latest Ref Range: CLEAR  CLEAR (A)   Bacteria, UA Latest Ref Range: NONE SEEN  NONE SEEN   Bilirubin Urine Latest Ref Range: NEGATIVE  NEGATIVE   Color, Urine Latest Ref Range: YELLOW  YELLOW (A)   Glucose Latest Ref Range: NEGATIVE mg/dL NEGATIVE   Hgb urine dipstick Latest Ref Range: NEGATIVE  2+ (A)   Ketones, ur Latest Ref Range: NEGATIVE mg/dL NEGATIVE   Leukocytes, UA Latest Ref Range: NEGATIVE  NEGATIVE   Mucous Unknown PRESENT   Nitrite Latest Ref Range: NEGATIVE  NEGATIVE   pH Latest Ref Range: 5.0-8.0  6.0   Protein Latest Ref Range: NEGATIVE mg/dL NEGATIVE   RBC / HPF Latest Ref Range: 0-5 RBC/hpf 6-30   Specific Gravity, Urine Latest Ref Range: 1.005-1.030  1.014   Squamous Epithelial / LPF Latest Ref Range: NONE SEEN  0-5 (A)   WBC, UA Latest Ref Range: 0-5 WBC/hpf 0-5   Alcohol, Ethyl (B) Latest Ref Range: <5 mg/dL <5   Amphetamines, Ur Screen Latest Ref Range: NONE DETECTED  NONE DETECTED   Barbiturates, Ur Screen Latest Ref Range: NONE DETECTED  NONE DETECTED   Benzodiazepine, Ur Scrn Latest Ref Range: NONE DETECTED  NONE DETECTED   Cocaine Metabolite,Ur [place] Latest Ref Range: NONE DETECTED  NONE DETECTED   Methadone Scn, Ur Latest Ref Range: NONE DETECTED  NONE DETECTED   MDMA (Ecstasy)Ur Screen Latest Ref Range: NONE DETECTED  NONE DETECTED   Cannabinoid 50 Ng, Ur [place] Latest Ref Range: NONE DETECTED  NONE DETECTED   Opiate, Ur Screen Latest Ref Range: NONE  DETECTED  NONE DETECTED   Phencyclidine (PCP) Ur S Latest Ref Range: NONE DETECTED  NONE DETECTED   Tricyclic, Ur Screen Latest Ref Range: NONE DETECTED  NONE DETECTED        Medication List    TAKE these medications      Indication   ARIPiprazole 10 MG tablet  Commonly known as:  ABILIFY  Take 1 tablet (10 mg total) by mouth daily.  Notes to Patient:  depression      buPROPion 150 MG 12 hr tablet  Commonly known as:  WELLBUTRIN SR  Take 1 tablet (150 mg total) by mouth 2 (two) times daily.  Notes to Patient:  depression      gabapentin 300 MG capsule  Commonly known as:  NEURONTIN  Take 1 capsule (300 mg total) by mouth 2 (two) times daily.  Notes to Patient:  pain      venlafaxine XR 75 MG 24 hr capsule  Commonly known as:  EFFEXOR-XR  Take 3 capsules (225 mg total) by mouth daily with breakfast.  Notes to Patient:  depression            Follow-up Information    Follow up with [facility]. Go on 11/10/2014.   Why:  For follow-up care appt Monday 11/10/14 at 7:00am    Contact information:   [place], [place] Ph [phone] Fax [phone]      Total Discharge Time: 30 minutes  Signed: Hildred Priest 11/03/2014, 11:39 AM

## 2014-11-03 NOTE — Progress Notes (Signed)
Patient discharged home with mother. Instructions reviewed. Patient verbalized understanding of meds and follow up care. Denies SI/HI/AVH. Belongings sent home with her husband prior to discharge.

## 2014-11-03 NOTE — [facility] Suicide Risk Assessment (Signed)
[facility]   Demographic Factors:  Caucasian  Total Time spent with patient: 30 minutes    Psychiatric Specialty Exam: Physical Exam  ROS  Have you used any form of tobacco in the last 30 days? (Cigarettes, Smokeless Tobacco, Cigars, and/or Pipes): Yes  Has this patient used any form of tobacco in the last 30 days? (Cigarettes, Smokeless Tobacco, Cigars, and/or Pipes) Yes, A prescription for an FDA-approved tobacco cessation medication was offered at discharge and the patient refused  Mental Status Per Nursing Assessment::   On Admission:  Suicidal ideation indicated by patient, Suicide plan, Self-harm thoughts, Self-harm behaviors, Intention to act on suicide plan, Belief that plan would result in death  Current Mental Status by Physician: hopeful, future oriented. denies SI, HI.  Describes mood as much improved.  Loss Factors: NA  Historical Factors: Prior suicide attempts and Impulsivity  Risk Reduction Factors:   Responsible for children under 618 years of age  Continued Clinical Symptoms:  Depression:   Impulsivity Previous Psychiatric Diagnoses and Treatments  Cognitive Features That Contribute To Risk:  None    Suicide Risk:  Minimal: No identifiable suicidal ideation.  Patients presenting with no risk factors but with morbid ruminations; may be classified as minimal risk based on the severity of the depressive symptoms  Principal Problem: Severe episode of recurrent major depressive disorder, without psychotic features [facility](HCC) Discharge Diagnoses:  Patient Active Problem List   Diagnosis Date Noted  . Severe episode of recurrent major depressive disorder, without psychotic features ([facility]) [F33.2]   . Severe recurrent major depression without psychotic features ([facility]) [F33.2] 10/30/2014  . Alcohol abuse [F10.10] 10/30/2014  . 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 Bipolar affective disorder ([facility]) [F31.9] 09/06/2011     Follow-up Information    Follow up with [facility]. Go on 11/10/2014.   Why:  For follow-up care appt Monday 11/10/14 at 7:00am    Contact information:   [address] [place], [place] Ph [phone] Fax [phone]       Is patient on multiple antipsychotic therapies at discharge:  No   Has Patient had three or more failed trials of antipsychotic monotherapy by history:  No  Recommended Plan for Multiple Antipsychotic Therapies: NA    Jimmy FootmanHernandez-Gonzalez,  Najma Bozarth 11/03/2014, 11:39 AM

## 2014-11-03 NOTE — Progress Notes (Signed)
  [facility] :  Will you be returning to the same living situation after discharge:  Yes,  paitient will return home where she lives alone but has family support from her mom and exhusband At discharge, do you have transportation home?: Yes,  patient's mother will pick her up at discharge Do you have the ability to pay for your medications: Yes,  patient has Medicaid  Release of information consent forms completed and in the chart;  Patient's signature needed at discharge.  Patient to Follow up at: Follow-up Information    Follow up with [facility]. Go on 11/10/2014.   Why:  For follow-up care appt Monday 11/10/14 at 7:00am    Contact information:   [address] [place], [place] Ph [phone] Fax [phone]      Patient denies SI/HI: Yes,  patient denies SI/HI    Safety Planning and Suicide Prevention discussed: Yes,  SPE discussed with patient and her mother Marta LamasDebbie Powell [phone]  Have you used any form of tobacco in the last 30 days? (Cigarettes, Smokeless Tobacco, Cigars, and/or Pipes): Yes  Has patient been referred to the Quitline?: Patient refused referral  Lulu Ridingngle, Garret Teale T, MSW, LCSWA 11/03/2014, 12:07 PM

## 2014-11-03 NOTE — Progress Notes (Signed)
D: Patient denies SI/HI/AVH.  Patient affect is sad and her mood is depressed.  Patient did attend evening group. Patient visible on the milieu. No distress noted. A: Support and encouragement offered. Scheduled medications given to pt. Q 15 min checks continued for patient safety. R: Patient receptive. Patient remains safe on the unit.

## 2014-11-03 NOTE — [facility] Suicide Risk Assessment (Signed)
[facility] INPATIENT:  Family/Significant Other Suicide Prevention Education  Suicide Prevention Education:  Education Completed; Marta LamasDebbie Powell (mother) [phone] has been identified by the patient as the family member/significant other with whom the patient will be residing, and identified as the person(s) who will aid the patient in the event of a mental health crisis (suicidal ideations/suicide attempt).  With written consent from the patient, the family member/significant other has been provided the following suicide prevention education, prior to the and/or following the discharge of the patient.  The suicide prevention education provided includes the following:  Suicide risk factors  Suicide prevention and interventions  National Suicide Hotline telephone number  [facility] assessment telephone number  [facility] Emergency Assistance 911  [facility] and/or Residential Mobile Crisis Unit telephone number  Request made of family/significant other to:  Remove weapons (e.g., guns, rifles, knives), all items previously/currently identified as safety concern.    Remove drugs/medications (over-the-counter, prescriptions, illicit drugs), all items previously/currently identified as a safety concern.  The family member/significant other verbalizes understanding of the suicide prevention education information provided.  The family member/significant other agrees to remove the items of safety concern listed above.  Lulu RidingIngle, Iceis Knab T, MSW, LCSWA 11/03/2014, 11:06 AM

## 2014-11-03 NOTE — Plan of Care (Signed)
Problem: [facility] Participation in Recreation Therapeutic Interventions Goal: STG-Patient will demonstrate improved self esteem by identif STG: Self-Esteem - Within 3 treatment sessions, patient will verbalize at least 5 positive affirmation statements in one treatment session to increase self-esteem post d/c.  Outcome: Completed/Met Date Met:  11/03/14 Treatment Session 1; Completed 1 out of 1: At approximately 12:15 pm, LRT met with patient in craft room. Patient verbalized 5 positive affirmation statements. Patient reported it felt "awkward, but good". LRT encouraged patient to continue saying positive affirmation statements. Intervention Used: I Am statements  Leonette Monarch, LRT/CTRS 10.31.16 5:18 pm Goal: STG-Patient will demonstrate improved communication skills b STG: Communication - Within 3 treatment sessions, patient will participate in communication activity in one treatment session to increase healthy communication skills post d/c.  Outcome: Completed/Met Date Met:  11/03/14 Treatment Session 1; Completed 1 out of 1: At approximately 12:15 pm, LRT met with patient in craft room. Patient was instructed to take as much toilet paper as she would use in 2 hours. For each square patient pulled off, patient was instructed to give a fun fact. Patient able to give fun fact for each square she pulled off. LRT educated patient on communication and trust. Intervention Used: Toilet paper communication game  Leonette Monarch, LRT/CTRS 10.31.16 5:20 pm Goal: STG-Other Recreation Therapy Goal (Specify) STG: Stress Management - Within 3 treatment sessions, patient will verbalize understanding of the stress management techniques in one treatment session to increase stress management skills post d/c.  Outcome: Completed/Met Date Met:  11/03/14 Treatment Session 1; Completed 1 out of 1: At approximately 12:15 pm, LRT met with patient in craft room. LRT educated and provided patient with handouts on  stress management techniques. Patient verbalized understanding. LRT encouraged patient to read over and practice the stress management techniques. Intervention Used: Mindfulness and Progressive Muscle Relaxation handouts  Leonette Monarch, LRT/CTRS 10.31.16 5:22 pm

## 2014-11-03 NOTE — Progress Notes (Signed)
Recreation Therapy Notes  INPATIENT RECREATION TR PLAN  Patient Details Name: [name] MRN: [id number] DOB: January 27, 1975 Today's Date: 11/03/2014  Rec Therapy Plan Is patient appropriate for Therapeutic Recreation?: Yes Treatment times per week: At least once a week TR Treatment/Interventions: 1:1 session, Group participation (Comment) (Appropriate participation in daily recreation therapy tx)  Discharge Criteria Pt will be discharged from therapy if:: Treatment goals are met, Discharged Treatment plan/goals/alternatives discussed and agreed upon by:: Patient/family  Discharge Summary Short term goals set: See Care Plan Short term goals met: Complete Progress toward goals comments: One-to-one attended One-to-one attended: Self-esteem, stress management, communication Reason goals not met: N/A Therapeutic equipment acquired: None Reason patient discharged from therapy: Discharge from hospital Pt/family agrees with progress & goals achieved: Yes Date patient discharged from therapy: 11/03/14   Leonette Monarch, LRT/CTRS 11/03/2014, 5:23 PM

## 2015-12-03 ENCOUNTER — Emergency Department: Payer: Self-pay

## 2015-12-03 ENCOUNTER — Emergency Department
Admission: EM | Admit: 2015-12-03 | Discharge: 2015-12-03 | Disposition: A | Discharge status: Home / Self Care | Payer: Self-pay | Attending: Emergency Medicine | Admitting: Emergency Medicine

## 2015-12-03 ENCOUNTER — Encounter: Payer: Self-pay | Admitting: Emergency Medicine

## 2015-12-03 DIAGNOSIS — F17200 Nicotine dependence, unspecified, uncomplicated: Secondary | ICD-10-CM | POA: Insufficient documentation

## 2015-12-03 DIAGNOSIS — J40 Bronchitis, not specified as acute or chronic: Secondary | ICD-10-CM | POA: Insufficient documentation

## 2015-12-03 DIAGNOSIS — Z79899 Other long term (current) drug therapy: Secondary | ICD-10-CM | POA: Insufficient documentation

## 2015-12-03 DIAGNOSIS — K047 Periapical abscess without sinus: Secondary | ICD-10-CM | POA: Insufficient documentation

## 2015-12-03 MED ORDER — AMOXICILLIN 875 MG PO TABS: 875.0000 mg | ORAL_TABLET | Freq: Two times a day (BID) | ORAL | 0 refills | Status: DC

## 2015-12-03 MED ORDER — PSEUDOEPH-BROMPHEN-DM 30-2-10 MG/5ML PO SYRP: 10.0000 mL | ORAL_SOLUTION | Freq: Four times a day (QID) | ORAL | 0 refills | Status: DC | PRN

## 2015-12-03 MED ORDER — PREDNISONE 50 MG PO TABS: 50.0000 mg | ORAL_TABLET | Freq: Every day | ORAL | 0 refills | Status: DC

## 2015-12-03 MED ORDER — MAGIC MOUTHWASH W/LIDOCAINE: 5.0000 mL | Freq: Four times a day (QID) | ORAL | 0 refills | Status: DC

## 2015-12-03 NOTE — ED Triage Notes (Signed)
Pt with cough and wheezing for one week and also has dental abscess.

## 2015-12-03 NOTE — ED Notes (Addendum)
Pt also c/o tooth pain to left side, no swelling noted.

## 2015-12-03 NOTE — ED Provider Notes (Signed)
[facility] Emergency Department Provider Note  ____________________________________________  Time seen: Approximately 6:17 PM  I have reviewed the triage vital signs and the nursing notes.   HISTORY  Chief Complaint Cough; Wheezing; and Dental Pain    HPI Birdie HopesMargaret Bougher is a 40 y.o. female who presents to the emergency department complaining ofleft lower dental infection and coughing 1 week. Patient reports that she has had an infection in the site that had to be surgically incised and drained in the OR. She states that this is nowhere near the same severity. She reports redness and mild swelling around one tooth. Patient denies any difficult breathing or swallowing. Complaint. Patient states that this has been present for 2-3 weeks. Patient also reports having reduction of cough 1 week. She denies any nasal congestion, sore throat, fever and chills, abdominal pain, nausea or vomiting. No medications for complaint prior to arrival.     Past Medical History:  Diagnosis Date  . Depression   . Mental disorder     Patient Active Problem List   Diagnosis Date Noted  . Severe episode of recurrent major depressive disorder, without psychotic features (HCC)   . Severe recurrent major depression without psychotic features (HCC) 10/30/2014  . Alcohol abuse 10/30/2014  . Cluster B personality disorder 09/06/2014  . Bipolar affective disorder (HCC) 09/06/2011    Past Surgical History:  Procedure Laterality Date  . ABDOMINAL HYSTERECTOMY    . CHOLECYSTECTOMY      Prior to Admission medications   Medication Sig Start Date End Date Taking? Authorizing Provider  amoxicillin (AMOXIL) 875 MG tablet Take 1 tablet (875 mg total) by mouth 2 (two) times daily. 12/03/15   Delorise RoyalsJonathan D Amaury Kuzel, PA-C  ARIPiprazole (ABILIFY) 10 MG tablet Take 1 tablet (10 mg total) by mouth daily. 11/03/14   Jimmy FootmanAndrea Hernandez-Gonzalez, MD  brompheniramine-pseudoephedrine-DM 30-2-10  MG/5ML syrup Take 10 mLs by mouth 4 (four) times daily as needed. 12/03/15   Delorise RoyalsJonathan D Darious Rehman, PA-C  buPROPion (WELLBUTRIN SR) 150 MG 12 hr tablet Take 1 tablet (150 mg total) by mouth 2 (two) times daily. 11/03/14   Jimmy FootmanAndrea Hernandez-Gonzalez, MD  gabapentin (NEURONTIN) 300 MG capsule Take 1 capsule (300 mg total) by mouth 2 (two) times daily. 11/03/14   Jimmy FootmanAndrea Hernandez-Gonzalez, MD  magic mouthwash w/lidocaine SOLN Take 5 mLs by mouth 4 (four) times daily. 12/03/15   Delorise RoyalsJonathan D Emalie Mcwethy, PA-C  predniSONE (DELTASONE) 50 MG tablet Take 1 tablet (50 mg total) by mouth daily with breakfast. 12/03/15   Delorise RoyalsJonathan D Sherissa Tenenbaum, PA-C  venlafaxine XR (EFFEXOR-XR) 75 MG 24 hr capsule Take 3 capsules (225 mg total) by mouth daily with breakfast. 11/03/14   Jimmy FootmanAndrea Hernandez-Gonzalez, MD    Allergies Patient has no known allergies.  No family history on file.  Social History Social History  Substance Use Topics  . Smoking status: Current Every Day Smoker    Packs/day: 1.00    Years: 16.00  . Smokeless tobacco: Never Used     Comment: given info about patches and 2 wk free supply after discharge  . Alcohol use Yes     Comment: 48 oz beer qod     Review of Systems  Constitutional: No fever/chills Eyes: No visual changes. No discharge ZOX:WRUEAVWUENT:Positive for left lower dental pain Cardiovascular: no chest pain. Respiratory: Positive cough. No SOB. Gastrointestinal: No abdominal pain.  No nausea, no vomiting.  No diarrhea.  No constipation. Musculoskeletal: Negative for musculoskeletal pain. Skin: Negative for rash, abrasions, lacerations, ecchymosis. Neurological: Negative  for headaches, focal weakness or numbness. 10-point ROS otherwise negative.  ____________________________________________   PHYSICAL EXAM:  VITAL SIGNS: ED Triage Vitals  Enc Vitals Group     BP 12/03/15 1724 137/81     Pulse Rate 12/03/15 1724 83     Resp 12/03/15 1724 20     Temp 12/03/15 1724 98.6 F (37 C)      Temp Source 12/03/15 1724 Oral     SpO2 12/03/15 1724 97 %     Weight 12/03/15 1723 196 lb (88.9 kg)     Height 12/03/15 1723 5\' 7"  (1.702 m)     Head Circumference --      Peak Flow --      Pain Score 12/03/15 1723 7     Pain Loc --      Pain Edu? --      Excl. in GC? --      Constitutional: Alert and oriented. Well appearing and in no acute distress. Eyes: Conjunctivae are normal. PERRL. EOMI. Head: Atraumatic. ENT:      Ears:       Nose: No congestion/rhinnorhea.      Mouth/Throat: Mucous membranes are moist. Erythema and edema surrounding tooth #21. No acute findings of abscess requiring incision and drainage. Area is tender to palpation with tongue depressor. No drainage. Neck: No stridor. Neck is supple with full range of motion Hematological/Lymphatic/Immunilogical: No cervical lymphadenopathy. Cardiovascular: Normal rate, regular rhythm. Normal S1 and S2.  Good peripheral circulation. Respiratory: Normal respiratory effort without tachypnea or retractions. Lungs with scattered crackles bilateral lower lung fields. Mild expiratory wheezing. No rales or rhonchi.Peri [place]. Good air entry to the bases with no decreased or absent breath sounds. Musculoskeletal: Full range of motion to all extremities. No gross deformities appreciated. Neurologic:  Normal speech and language. No gross focal neurologic deficits are appreciated.  Skin:  Skin is warm, dry and intact. No rash noted. Psychiatric: Mood and affect are normal. Speech and behavior are normal. Patient exhibits appropriate insight and judgement.   ____________________________________________   LABS (all labs ordered are listed, but only abnormal results are displayed)  Labs Reviewed - No data to display ____________________________________________  EKG   ____________________________________________  RADIOLOGY Festus BarrenI, Alexismarie Flaim D Patriciaann Rabanal, personally viewed and evaluated these images (plain radiographs) as part of my medical  decision making, as well as reviewing the written report by the radiologist.  Dg Chest 2 View  Result Date: 12/03/2015 CLINICAL DATA:  Cough fever and wheezing EXAM: CHEST  2 VIEW COMPARISON:  05/19/2011 FINDINGS: The heart size and mediastinal contours are within normal limits. Both lungs are clear. The visualized skeletal structures are unremarkable. Surgical clips in the right upper quadrant. IMPRESSION: No active cardiopulmonary disease. Electronically Signed   By: Jasmine PangKim  Fujinaga M.D.   On: 12/03/2015 18:47    ____________________________________________    PROCEDURES  Procedure(s) performed:    Procedures    Medications - No data to display   ____________________________________________   INITIAL IMPRESSION / ASSESSMENT AND PLAN / ED COURSE  Pertinent labs & imaging results that were available during my care of the patient were reviewed by me and considered in my medical decision making (see chart for details).  Review of the [place] CSRS was performed in accordance of the [facility] prior to dispensing any controlled drugs.  Clinical Course     Patient's diagnosis is consistent with bronchitis and dental infection. Patient presents emergency Department with dental infection to the left lower dentition. No indication at this time for  incision and draining or further labs or imaging. Patient will be treated with antibiotics and magic mouthwash for this complaint. Patient also had cough 1 week. This is evaluated with chest x-ray. No consolidation concerning for pneumonia. Patient will be discharged home with cough medication and steroids.. Patient will follow up primary care or dentist as needed.. Patient is given ED precautions to return to the ED for any worsening or new symptoms.     ____________________________________________  FINAL CLINICAL IMPRESSION(S) / ED DIAGNOSES  Final diagnoses:  Dental infection  Bronchitis      NEW MEDICATIONS STARTED DURING THIS  VISIT:  New Prescriptions   AMOXICILLIN (AMOXIL) 875 MG TABLET    Take 1 tablet (875 mg total) by mouth 2 (two) times daily.   BROMPHENIRAMINE-PSEUDOEPHEDRINE-DM 30-2-10 MG/5ML SYRUP    Take 10 mLs by mouth 4 (four) times daily as needed.   MAGIC MOUTHWASH W/LIDOCAINE SOLN    Take 5 mLs by mouth 4 (four) times daily.   PREDNISONE (DELTASONE) 50 MG TABLET    Take 1 tablet (50 mg total) by mouth daily with breakfast.        This chart was dictated using voice recognition software/Dragon. Despite best efforts to proofread, errors can occur which can change the meaning. Any change was purely unintentional.    Racheal Patches, PA-C 12/03/15 1938    Loleta Rose, MD 12/03/15 2017

## 2016-08-03 ENCOUNTER — Emergency Department
Admission: EM | Admit: 2016-08-03 | Discharge: 2016-08-03 | Disposition: A | Discharge status: Home / Self Care | Payer: Self-pay | Attending: Emergency Medicine | Admitting: Emergency Medicine

## 2016-08-03 DIAGNOSIS — F17210 Nicotine dependence, cigarettes, uncomplicated: Secondary | ICD-10-CM | POA: Insufficient documentation

## 2016-08-03 DIAGNOSIS — Z79899 Other long term (current) drug therapy: Secondary | ICD-10-CM | POA: Insufficient documentation

## 2016-08-03 DIAGNOSIS — R51 Headache: Secondary | ICD-10-CM | POA: Insufficient documentation

## 2016-08-03 DIAGNOSIS — K029 Dental caries, unspecified: Secondary | ICD-10-CM | POA: Insufficient documentation

## 2016-08-03 DIAGNOSIS — K047 Periapical abscess without sinus: Secondary | ICD-10-CM | POA: Insufficient documentation

## 2016-08-03 MED ORDER — KETOROLAC TROMETHAMINE 10 MG PO TABS: 10.0000 mg | ORAL_TABLET | Freq: Four times a day (QID) | ORAL | 0 refills | Status: AC | PRN

## 2016-08-03 MED ORDER — KETOROLAC TROMETHAMINE 30 MG/ML IJ SOLN
30.0000 mg | Freq: Once | INTRAMUSCULAR | Status: AC
  Administered 2016-08-03: 30 mg via INTRAMUSCULAR
  Filled 2016-08-03: qty 1

## 2016-08-03 MED ORDER — AMOXICILLIN 500 MG PO TABS: 500.0000 mg | ORAL_TABLET | Freq: Two times a day (BID) | ORAL | 0 refills | Status: DC

## 2016-08-03 NOTE — ED Notes (Signed)
First nurse: pt presents with dental abscess and headache. NAD>

## 2016-08-03 NOTE — ED Triage Notes (Signed)
Pt c/o an abscess tooth on the right lower side for over a week. Pt reports now with HA and difficulty eating.

## 2016-08-03 NOTE — ED Provider Notes (Signed)
[facility] Emergency Department Provider Note   ____________________________________________   I have reviewed the triage vital signs and the nursing notes.   HISTORY  Chief Complaint Dental Pain and Headache    HPI [name] is a 41 y.o. female presents to the emergency department with right lower jaw dental pain that has been going on for approximately one week now. Patient reports since the onset of dental pain she notes increased cough and congestion, headache and general feeling of malaise. Patient feels she may have had a fever although she is afebrile at this time. Patient does not see a dentist regularly and reports in the area of the dental pain broken tooth with significant dental caries. Patient denies pain along the neck below her behind the ear or any sense of swelling in those areas. Patient denies fever, chills, headache, vision changes, chest pain, chest tightness, shortness of breath, abdominal pain, nausea and vomiting.  Past Medical History:  Diagnosis Date  . Depression   . Mental disorder     Patient Active Problem List   Diagnosis Date Noted  . Severe episode of recurrent major depressive disorder, without psychotic features (HCC)   . Severe recurrent major depression without psychotic features (HCC) 10/30/2014  . Alcohol abuse 10/30/2014  . Cluster B personality disorder 09/06/2014  . Bipolar affective disorder (HCC) 09/06/2011    Past Surgical History:  Procedure Laterality Date  . ABDOMINAL HYSTERECTOMY    . CHOLECYSTECTOMY      Prior to Admission medications   Medication Sig Start Date End Date Taking? Authorizing Provider  ARIPiprazole (ABILIFY) 10 MG tablet Take 1 tablet (10 mg total) by mouth daily. 11/03/14  Yes Jimmy FootmanHernandez-Gonzalez, Andrea, MD  buPROPion [facility](WELLBUTRIN SR) 150 MG 12 hr tablet Take 1 tablet (150 mg total) by mouth 2 (two) times daily. 11/03/14  Yes Jimmy FootmanHernandez-Gonzalez, Andrea, MD  gabapentin  (NEURONTIN) 300 MG capsule Take 1 capsule (300 mg total) by mouth 2 (two) times daily. 11/03/14  Yes Jimmy FootmanHernandez-Gonzalez, Andrea, MD  venlafaxine XR (EFFEXOR-XR) 75 MG 24 hr capsule Take 3 capsules (225 mg total) by mouth daily with breakfast. 11/03/14  Yes Jimmy FootmanHernandez-Gonzalez, Andrea, MD  amoxicillin (AMOXIL) 500 MG tablet Take 1 tablet (500 mg total) by mouth 2 (two) times daily. 08/03/16   Maximillian Habibi M, PA-C  ketorolac (TORADOL) 10 MG tablet Take 1 tablet (10 mg total) by mouth every 6 (six) hours as needed. 08/03/16 08/08/16  Malai Lady, Jordan Likesraci M, PA-C    Allergies Patient has no known allergies.  No family history on file.  Social History Social History  Substance Use Topics  . Smoking status: Current Every Day Smoker    Packs/day: 1.00    Years: 16.00  . Smokeless tobacco: Never Used     Comment: given info about patches and 2 wk free supply after discharge  . Alcohol use Yes     Comment: 48 oz beer qod    Review of Systems Constitutional:Negative for fever/chills Eyes: No visual changes. ENT:  Negative for sore throat and for difficulty swallowing. Left lower jaw dental pain.  Cardiovascular: Denies chest pain. Respiratory: Denies cough. Denies shortness of breath. Gastrointestinal: No abdominal pain.  No nausea, vomiting, diarrhea. Genitourinary: Negative for dysuria. Musculoskeletal: Negative for back pain. Skin: Negative for rash. Neurological: Negative for headaches.  Negative focal weakness or numbness. Negative for loss of consciousness. Able to ambulate. ___________________________________________   PHYSICAL EXAM:  VITAL SIGNS: ED Triage Vitals  Enc Vitals Group  BP 08/03/16 1021 135/90     Pulse Rate 08/03/16 1021 84     Resp 08/03/16 1021 20     Temp 08/03/16 1021 97.8 F (36.6 C)     Temp Source 08/03/16 1021 Oral     SpO2 08/03/16 1021 100 %     Weight 08/03/16 1021 215 lb (97.5 kg)     Height 08/03/16 1021 5\' 7"  (1.702 m)     Head Circumference --       Peak Flow --      Pain Score 08/03/16 1020 7     Pain Loc --      Pain Edu? --      Excl. in GC? --     Constitutional: Alert and oriented. Well appearing and in no acute distress.Afebrile.  Head: Normocephalic and atraumatic. Eyes: Conjunctivae are normal. PERRL. Normal extraocular movements.  Ears: Canals clear. TMs intact bilaterally. Nose: No congestion/rhinorrhea Mouth/Throat: Mucous membranes are moist. Oropharynx clear. Tonsils symmetrical bilaterally. Dental caries left lower molar, #35 with gumline erythematous. Mild mucosal swelling. No swelling, induration or erythema noted along the mandibular region of the left side of the face. Neck: Supple.  Hematological/Lymphatic/Immunological: No lymphadenopathy. Cardiovascular: Normal rate, regular rhythm. Normal distal pulses. Respiratory: Normal respiratory effort. No wheezes/rales/rhonchi. Lungs CTAB  Gastrointestinal: Soft and nontender.  Musculoskeletal: Nontender with normal range of motion in all extremities. Neurologic: Normal speech and language.  Skin: Skin is warm, dry and intact. No rash noted. Psychiatric: Mood and affect are normal.  ____________________________________________   LABS (all labs ordered are listed, but only abnormal results are displayed)  Labs Reviewed - No data to display ____________________________________________  EKG none ____________________________________________  RADIOLOGY none ____________________________________________   PROCEDURES  Procedure(s) performed:  none  Critical Care performed: no ____________________________________________   INITIAL IMPRESSION / ASSESSMENT AND PLAN / ED COURSE  Pertinent labs & imaging results that were available during my care of the patient were reviewed by me and considered in my medical decision making (see chart for details).   Patient presents to emergency department left lower jaw dental pain, #35 premolar. History and physical  exam findings are reassuring symptoms are consistent with dental pain associated with dental infection.  Patient will be prescribed amoxicillin for antibiotic coverage and 5 day course of Toradol for pain and inflammation.. Patient advised to follow up with a dental clinic for dental caries for the affected tooth or return to the emergency department if symptoms return or worsen. Patient informed of clinical course, understand medical decision-making process, and agree with plan.    ____________________________________________   FINAL CLINICAL IMPRESSION(S) / ED DIAGNOSES  Final diagnoses:  Dental infection  Dental caries       NEW MEDICATIONS STARTED DURING THIS VISIT:  Discharge Medication List as of 08/03/2016 11:12 AM    START taking these medications   Details  amoxicillin (AMOXIL) 500 MG tablet Take 1 tablet (500 mg total) by mouth 2 (two) times daily., Starting Wed 08/03/2016, Print    ketorolac (TORADOL) 10 MG tablet Take 1 tablet (10 mg total) by mouth every 6 (six) hours as needed., Starting Wed 08/03/2016, Until Mon 08/08/2016, Print         Note:  This document was prepared using Dragon voice recognition software and may include unintentional dictation errors.    Clois ComberLittle, Dayrin Stallone M, PA-C 08/03/16 1219    Sharyn CreamerQuale, Mark, MD 08/03/16 [phone]

## 2016-08-03 NOTE — Discharge Instructions (Signed)
Take medication as prescribed. Return to emergency department if symptoms worsen and follow-up with PCP as needed.    OPTIONS FOR DENTAL FOLLOW UP CARE  [place] [facility] - Local Safety Net Dental Clinics [email or web address]   [facility] [phone])  [facility] Ma [phone])  [place] [phone] ext [phone])  [facility] [phone])  [facility] [phone]) This clinic caters to the indigent population and is on a lottery system. Location: [employer] [facility], [employer], [address] [place] Clinic Hours: Wednesdays from 6pm - 9pm, patients seen by a lottery system. For dates, call or go to [email or web address] Services: Cleanings, fillings and simple extractions. Payment Options: DENTAL WORK IS FREE OF CHARGE. Bring proof of income or support. Best way to get seen: Arrive at 5:15 pm - this is a lottery, NOT first come/first serve, so arriving earlier will not increase your chances of being seen.     [facility] [phone] Select option 1 for emergencies   Location: [facility], [place], [address], [place] Clinic Hours: No walk-ins accepted - call the day before to schedule an appointment. Check in times are 9:30 am and 1:30 pm. Services: Simple extractions, temporary fillings, pulpectomy/pulp debridement, uncomplicated abscess drainage. Payment Options: PAYMENT IS DUE AT THE TIME OF SERVICE.  Fee is usually $100-200, additional surgical procedures (e.g. abscess drainage) may be extra. Cash, checks, Visa/MasterCard accepted.  Can file Medicaid if patient is covered for dental - patient should call case worker to check. No discount for [facility] patients. Best way to get seen: MUST call the day before and get onto the schedule.  Can usually be seen the next 1-2 days. No walk-ins accepted.     [facility] [phone]   Location: [facility], [address] [place] Clinic Hours: M, W, Th, F 8am or 1:30pm, Tues 9a or 1:30 - first come/first served. Services: Simple extractions, temporary fillings, uncomplicated abscess drainage.  You do not need to be an [facility] resident. Payment Options: PAYMENT IS DUE AT THE TIME OF SERVICE. Dental insurance, otherwise sliding scale - bring proof of income or support. Depending on income and treatment needed, cost is usually $50-200. Best way to get seen: Arrive early as it is first come/first served.     [facility] [phone]   Location: [address] Clinic Hours: Mon-Thu 8a-5p Services: Most basic dental services including extractions and fillings. Payment Options: PAYMENT IS DUE AT THE TIME OF SERVICE. Sliding scale, up to 50% off - bring proof if income or support. Medicaid with dental option accepted. Best way to get seen: Call to schedule an appointment, can usually be seen within 2 weeks OR they will try to see walk-ins - show up at 8a or 2p (you may have to wait).     [facility] [phone] [place] RESIDENTS ONLY   Location: [facility], [address], [place], [place] [address] Clinic Hours: By appointment only. Monday - Thursday 8am-5pm, Friday 8am-12pm Services: Cleanings, fillings, extractions. Payment Options: PAYMENT IS DUE AT THE TIME OF SERVICE. Cash, Visa or MasterCard. Sliding scale - $30 minimum per service. Best way to get seen: Come in to office, complete packet and make an appointment - need proof of income or support monies for each household member and proof of [facility] residence. Usually takes about a  takes about a month to get in. °  °  °[facility] °[phone] °   °Location: °[address] [place] °Clinic Hours: Walk-in Urgent Care Dental Services are offered Monday-Friday mornings only. °The numbers of emergencies accepted daily is limited to the number of °providers available. °Maximum 15 - Mondays, Wednesdays & Thursdays °Maximum 10 - Tuesdays & Fridays °Services: °You do not need to be a [place] resident to be seen for a dental emergency. °Emergencies are defined as pain, swelling, abnormal bleeding, or dental trauma. Walkins will receive x-rays if needed. °NOTE: Dental cleaning is not an emergency. °Payment Options: °PAYMENT IS DUE AT THE TIME OF SERVICE. °Minimum co-pay is $40.00 for uninsured patients. °Minimum co-pay is $3.00 for Medicaid with dental coverage. °Dental Insurance is accepted and must be presented at time of visit. °Medicare does not cover dental. °Forms of payment: Cash, credit card, checks. °Best way to get seen: °If not previously registered with the clinic, walk-in dental registration begins at 7:15 am and is on a first come/first serve basis. °If previously registered with the clinic, call to make an appointment. °  °  °[facility] °[phone] °[place] RESIDENTS ONLY °  °Location: °[address], [place], [place] °Clinic Hours: °Mon-Thu 10a-2p °Services: Extractions only! °Payment Options: °FREE (donations accepted) - bring proof of income or support °Best way to get seen: °Call and schedule an appointment OR come at 8am on the 1st Monday of every month (except for holidays) when it is first come/first served. °  °  °[facility] °[phone] °  °Location: °[address], [place] °Clinic Hours: °Friday mornings °Services, Payment Options, Best way to get seen: °Call for info  °

## 2016-08-04 ENCOUNTER — Telehealth: Payer: Self-pay | Admitting: Pharmacy Technician

## 2016-08-04 NOTE — Telephone Encounter (Signed)
Patient failed to provide 2018 proof of income.  No additional medication assistance will be provided by [facility] without the required proof of income documentation.  Patient notified by letter.  Dequann Vandervelden J. Krystalle Pilkington Care Manager [facility] 

## 2016-12-28 ENCOUNTER — Telehealth: Payer: Self-pay | Admitting: Pharmacy Technician

## 2016-12-28 NOTE — Telephone Encounter (Signed)
Patient failed to provide current poi.  No additional medication assistance will be provided by [facility] without the required proof of income documentation.  Patient notified by letter.  Comfort Iversen J. Rache Klimaszewski Care Manager [facility] 

## 2017-01-21 ENCOUNTER — Other Ambulatory Visit: Payer: Self-pay

## 2017-01-21 ENCOUNTER — Encounter: Payer: Self-pay | Admitting: *Deleted

## 2017-01-21 ENCOUNTER — Emergency Department
Admission: EM | Admit: 2017-01-21 | Discharge: 2017-01-24 | Disposition: A | Discharge status: Other Acute Inpatient Hospital | Payer: Self-pay | Attending: Emergency Medicine | Admitting: Emergency Medicine

## 2017-01-21 DIAGNOSIS — F17210 Nicotine dependence, cigarettes, uncomplicated: Secondary | ICD-10-CM | POA: Insufficient documentation

## 2017-01-21 DIAGNOSIS — Z79899 Other long term (current) drug therapy: Secondary | ICD-10-CM | POA: Insufficient documentation

## 2017-01-21 DIAGNOSIS — R45851 Suicidal ideations: Secondary | ICD-10-CM | POA: Insufficient documentation

## 2017-01-21 DIAGNOSIS — F332 Major depressive disorder, recurrent severe without psychotic features: Secondary | ICD-10-CM | POA: Insufficient documentation

## 2017-01-21 LAB — COMPREHENSIVE METABOLIC PANEL
ALT: 25 U/L (ref 14–54)
AST: 29 U/L (ref 15–41)
Albumin: 4.4 g/dL (ref 3.5–5.0)
Alkaline Phosphatase: 72 U/L (ref 38–126)
Anion gap: 10 (ref 5–15)
BUN: 7 mg/dL (ref 6–20)
CALCIUM: 9.2 mg/dL (ref 8.9–10.3)
CHLORIDE: 100 mmol/L — AB (ref 101–111)
CO2: 30 mmol/L (ref 22–32)
CREATININE: 0.74 mg/dL (ref 0.44–1.00)
GFR calc Af Amer: >60 mL/min (ref >60)
GFR calc non Af Amer: >60 mL/min (ref >60)
GLUCOSE: 115 mg/dL — AB (ref 65–99)
Potassium: 3.5 mmol/L (ref 3.5–5.1)
SODIUM: 140 mmol/L (ref 135–145)
Total Bilirubin: 0.6 mg/dL (ref 0.3–1.2)
Total Protein: 8.2 g/dL — ABNORMAL HIGH (ref 6.5–8.1)

## 2017-01-21 LAB — ACETAMINOPHEN LEVEL: Acetaminophen (Tylenol), Serum: <10 ug/mL — ABNORMAL LOW (ref 10–30)

## 2017-01-21 LAB — ETHANOL: Alcohol, Ethyl (B): 23 mg/dL — ABNORMAL HIGH (ref <10)

## 2017-01-21 LAB — CBC
HEMATOCRIT: 46.3 % (ref 35.0–47.0)
HEMOGLOBIN: 15.6 g/dL (ref 12.0–16.0)
MCH: 31 pg (ref 26.0–34.0)
MCHC: 33.6 g/dL (ref 32.0–36.0)
MCV: 92.4 fL (ref 80.0–100.0)
Platelets: 307 10*3/uL (ref 150–440)
RBC: 5.01 MIL/uL (ref 3.80–5.20)
RDW: 14.1 % (ref 11.5–14.5)
WBC: 11.6 10*3/uL — ABNORMAL HIGH (ref 3.6–11.0)

## 2017-01-21 LAB — POCT PREGNANCY, URINE: Preg Test, Ur: NEGATIVE

## 2017-01-21 LAB — URINE DRUG SCREEN, QUALITATIVE (ARMC ONLY)
Amphetamines, Ur Screen: NOT DETECTED
Barbiturates, Ur Screen: NOT DETECTED
Benzodiazepine, Ur Scrn: NOT DETECTED
CANNABINOID 50 NG, UR ~~LOC~~: POSITIVE — AB
COCAINE METABOLITE, UR ~~LOC~~: NOT DETECTED
MDMA (ECSTASY) UR SCREEN: NOT DETECTED
Methadone Scn, Ur: NOT DETECTED
OPIATE, UR SCREEN: NOT DETECTED
PHENCYCLIDINE (PCP) UR S: NOT DETECTED
TRICYCLIC, UR SCREEN: NOT DETECTED

## 2017-01-21 LAB — SALICYLATE LEVEL: Salicylate Lvl: <7 mg/dL (ref 2.8–30.0)

## 2017-01-21 MED ORDER — TRAZODONE HCL 100 MG PO TABS
100.0000 mg | ORAL_TABLET | Freq: Every evening | ORAL | Status: DC | PRN
  Administered 2017-01-21 – 2017-01-23 (×3): 100 mg via ORAL
  Filled 2017-01-21 (×3): qty 1

## 2017-01-21 MED ORDER — GABAPENTIN 300 MG PO CAPS
300.0000 mg | ORAL_CAPSULE | Freq: Three times a day (TID) | ORAL | Status: DC
  Administered 2017-01-21 – 2017-01-24 (×8): 300 mg via ORAL
  Filled 2017-01-21 (×8): qty 1

## 2017-01-21 MED ORDER — FLUOXETINE HCL 20 MG PO CAPS
20.0000 mg | ORAL_CAPSULE | Freq: Every day | ORAL | Status: DC
  Administered 2017-01-22 – 2017-01-24 (×3): 20 mg via ORAL
  Filled 2017-01-21 (×3): qty 1

## 2017-01-21 NOTE — ED Provider Notes (Signed)
[facility] Emergency Department Provider Note   ____________________________________________   First MD Initiated Contact with Patient 01/21/17 2045     (approximate)  I have reviewed the triage vital signs and the nursing notes.   HISTORY  Chief Complaint Suicidal   HPI [name] is a 42 y.o. female here for evaluation of thoughts of plan to harm herself.  Patient is alert, tells me that she was drinking alcohol tonight, she has some sort of family argument or disconnect with her husband.  She reports that she feels ashamed of herself and feels crazy.  She denies any plan or active ideation to hurt herself, but reports that she is contemplated harming herself in the past.  Does use alcohol at times.  Has a history of bipolar disorder.  Also reports major depression.  Denies overdose or reports previous hysterectomy.  Denies recent medical illness  Past Medical History:  Diagnosis Date  . Depression   . Mental disorder     Patient Active Problem List   Diagnosis Date Noted  . Severe episode of recurrent major depressive disorder, without psychotic features (HCC)   . Severe recurrent major depression without psychotic features (HCC) 10/30/2014  . Alcohol abuse 10/30/2014  . Cluster B personality disorder (HCC) 09/06/2014  . Bipolar affective disorder (HCC) 09/06/2011    Past Surgical History:  Procedure Laterality Date  . ABDOMINAL HYSTERECTOMY    . CHOLECYSTECTOMY      Prior to Admission medications   Medication Sig Start Date End Date Taking? Authorizing Provider  FLUoxetine (PROZAC) 10 MG tablet Take 10 mg by mouth daily.   Yes [provider]  traZODone (DESYREL) 50 MG tablet Take 50 mg by mouth at bedtime.   Yes [provider]  amoxicillin (AMOXIL) 500 MG tablet Take 1 tablet (500 mg total) by mouth 2 (two) times daily. 08/03/16   Little, Traci M, PA-C  ARIPiprazole (ABILIFY) 10 MG tablet Take 1 tablet (10 mg  total) by mouth daily. 11/03/14   Jimmy Footman, MD  buPROPion (WELLBUTRIN SR) 150 MG 12 hr tablet Take 1 tablet (150 mg total) by mouth 2 (two) times daily. 11/03/14   Jimmy Footman, MD  gabapentin (NEURONTIN) 300 MG capsule Take 1 capsule (300 mg total) by mouth 2 (two) times daily. 11/03/14   Jimmy Footman, MD  venlafaxine XR (EFFEXOR-XR) 75 MG 24 hr capsule Take 3 capsules (225 mg total) by mouth daily with breakfast. 11/03/14   Jimmy Footman, MD    Allergies Patient has no known allergies.  History reviewed. No pertinent family history.  Social History Social History   Tobacco Use  . Smoking status: Current Every Day Smoker    Packs/day: 1.00    Years: 16.00    Pack years: 16.00  . Smokeless tobacco: Never Used  . Tobacco comment: given info about patches and 2 wk free supply after discharge  Substance Use Topics  . Alcohol use: Yes    Comment: 48 oz beer qod  . Drug use: No    Review of Systems Patient tearful, refused to answer some questions, full review of systems can be done Constitutional: No fever/chills Cardiovascular: Denies chest pain. Respiratory: Denies shortness of breath. Gastrointestinal: No abdominal pain.  Denies pregnancy, reports previous hysterectomy. Genitourinary: Negative for dysuria. Musculoskeletal: Negative for back pain. Neurological: Negative for headaches or weakness.    ____________________________________________   PHYSICAL EXAM:  VITAL SIGNS: ED Triage Vitals [01/21/17 1945]  Enc Vitals Group  BP      Pulse      Resp      Temp      Temp src      SpO2      Weight 215 lb (97.5 kg)     Height 5\' 7"  (1.702 m)     Head Circumference      Peak Flow      Pain Score      Pain Loc      Pain Edu?      Excl. in GC?     Constitutional: Alert and oriented.  Tearful eyes: Conjunctivae are slightly injected bilateral. Head: Atraumatic. Nose: No  congestion/rhinnorhea. Mouth/Throat: Mucous membranes are moist. Neck: No stridor.   Cardiovascular: Normal rate, regular rhythm. Grossly normal heart sounds.  Good peripheral circulation. Respiratory: Normal respiratory effort.  No retractions. Lungs CTAB. Gastrointestinal: Soft and nontender.  Musculoskeletal: No lower extremity tenderness nor edema.  Moves all extremities equally. Neurologic:  Normal speech and language. No gross focal neurologic deficits are appreciated.  Skin:  Skin is warm, dry and intact. No rash noted. Psychiatric: Mood and affect are flat, somewhat withdrawn, tearful.  Denies active suicidal ideation.  Reports she thinks frequently about suicide though.  Vitals:   01/21/17 2201  BP: 103/72  Pulse: 85  Resp: 16  Temp: 97.7 F (36.5 C)  SpO2: 95%    ____________________________________________   LABS (all labs ordered are listed, but only abnormal results are displayed)  Labs Reviewed  COMPREHENSIVE METABOLIC PANEL - Abnormal; Notable for the following components:      Result Value   Chloride 100 (*)    Glucose, Bld 115 (*)    Total Protein 8.2 (*)    All other components within normal limits  ETHANOL - Abnormal; Notable for the following components:   Alcohol, Ethyl (B) 23 (*)    All other components within normal limits  ACETAMINOPHEN LEVEL - Abnormal; Notable for the following components:   Acetaminophen (Tylenol), Serum <10 (*)    All other components within normal limits  CBC - Abnormal; Notable for the following components:   WBC 11.6 (*)    All other components within normal limits  URINE DRUG SCREEN, QUALITATIVE ([facility] ONLY) - Abnormal; Notable for the following components:   Cannabinoid 50 Ng, Ur [place] POSITIVE (*)    All other components within normal limits  SALICYLATE LEVEL  POC URINE PREG, ED  POCT PREGNANCY, URINE    ____________________________________________  EKG   ____________________________________________  RADIOLOGY   ____________________________________________   PROCEDURES  Procedure(s) performed: None  Procedures  Critical Care performed: No  ____________________________________________   INITIAL IMPRESSION / ASSESSMENT AND PLAN / ED COURSE  Pertinent labs & imaging results that were available during my care of the patient were reviewed by me and considered in my medical decision making (see chart for details).  Patient presents for evaluation for suicidal thoughts, reports a significant problem with the relationship between her and her husband.  She endorses having thoughts of suicide but no active ideation at this time.  Denies overdose or ingestion.  She does report using alcohol tonight, reports feeling very sad with low mood and a history of major depression.  ----------------------------------------- 10:04 PM on 01/21/2017 -----------------------------------------  No evidence of acute medical etiology, patient appears medically stable for psychiatric evaluation.  I have placed a consult for tele-psychiatry.  Patient under IVC due to her thoughts of suicidal ideation at times, currently denying active suicidal ideation.  Move patient  to the ED BHU for further care and psychiatric consultation.      ____________________________________________   FINAL CLINICAL IMPRESSION(S) / ED DIAGNOSES  Final diagnoses:  Severe episode of recurrent major depressive disorder, without psychotic features (HCC)      NEW MEDICATIONS STARTED DURING THIS VISIT:  New Prescriptions   No medications on file     Note:  This document was prepared using Dragon voice recognition software and may include unintentional dictation errors.     Sharyn CreamerQuale, Marili Vader, MD 01/21/17 2205

## 2017-01-21 NOTE — ED Notes (Signed)
Patient is IVC and is pending SOC consult. 

## 2017-01-21 NOTE — ED Notes (Signed)
BEHAVIORAL HEALTH ROUNDING  Patient sleeping: No.  Patient alert and oriented: yes  Behavior appropriate: Yes. ; If no, describe:  Nutrition and fluids offered: Yes  Toileting and hygiene offered: Yes  Sitter present: not applicable, Q 15 min safety rounds and observation.  Law enforcement present: Yes ODS  

## 2017-01-21 NOTE — ED Notes (Signed)

## 2017-01-21 NOTE — ED Notes (Addendum)
Spoke to husband before he left; says his wife has been here before with mental health issues; tonight he says she's been drinking alcohol and expressing suicidal thoughts; his number in demographics is correct; he will call later to check on her

## 2017-01-21 NOTE — ED Triage Notes (Signed)
Pt states she is "crazy" and she "doesn't want to live like this anymore". Pt sees a psychiatrist who writes rx's for her. Pt is a poor historian and unable to verify her psychiatric diagnoses. Pt admits to drinking ETOH daily. Pt is dramatic and crying, does not admit to using any drugs other than what she is prescribed. Pt states she is "ashamed" and "I'm so crazy." Pt denies making a plan to kill herself. Pt's husband brought her to ED.

## 2017-01-22 MED ORDER — FLUOXETINE HCL 20 MG PO CAPS
40.0000 mg | ORAL_CAPSULE | Freq: Every day | ORAL | Status: DC
  Filled 2017-01-22: qty 2

## 2017-01-22 MED ORDER — FOLIC ACID 1 MG PO TABS
1.0000 mg | ORAL_TABLET | Freq: Two times a day (BID) | ORAL | Status: DC
  Administered 2017-01-22 – 2017-01-24 (×5): 1 mg via ORAL
  Filled 2017-01-22 (×5): qty 1

## 2017-01-22 MED ORDER — DIAZEPAM 5 MG PO TABS
5.0000 mg | ORAL_TABLET | Freq: Every morning | ORAL | Status: DC
  Administered 2017-01-22 – 2017-01-24 (×3): 5 mg via ORAL
  Filled 2017-01-22 (×3): qty 1

## 2017-01-22 MED ORDER — VITAMIN B-1 100 MG PO TABS
100.0000 mg | ORAL_TABLET | Freq: Two times a day (BID) | ORAL | Status: DC
  Administered 2017-01-22 – 2017-01-24 (×5): 100 mg via ORAL
  Filled 2017-01-22 (×5): qty 1

## 2017-01-22 NOTE — BH Assessment (Signed)
Attempted to complete TTS assessment however, pt has taken medication and is sleeping. Unable to arouse. Will try again in the morning.

## 2017-01-22 NOTE — ED Notes (Signed)
Pt asking RN when she would be able to leave hospital. RN explained [facility]. Pt accepting. Pt then asked if she would be spending another night in the BHU. RN told her it was very likely.  Maintained on 15 minute checks and observation by security camera for safety.

## 2017-01-22 NOTE — ED Notes (Signed)
Patient asleep in room. No noted distress or abnormal behavior. Will continue 15 minute checks and observation by security cameras for safety. 

## 2017-01-22 NOTE — ED Notes (Signed)
[facility]  Machine set up in patients room.

## 2017-01-22 NOTE — ED Notes (Signed)
SOC talking to patient. 

## 2017-01-22 NOTE — ED Notes (Signed)

## 2017-01-22 NOTE — ED Provider Notes (Signed)
-----------------------------------------   3:02 AM on 01/22/2017 -----------------------------------------  [facility] Dr[facility] Loretha BrasilHulkower who recommends admission to inpatient psychiatry service.  Medication recommendations include Valium, fluoxetine, folate and thiamine.  These have been entered into the computer.   Irean HongSung, Zell Doucette J, MD 01/22/17 [phone]

## 2017-01-22 NOTE — ED Notes (Signed)
Pt speaking with her son on the phone. Pt is calm. Maintained on 15 minute checks and observation by security camera for safety.

## 2017-01-22 NOTE — ED Notes (Signed)
Pt calm and cooperative with RN.  Pt endorses SI. "I want to kill myself everyday."  Pt stated all she wants to do is sleep. Pt reports being with her husband for 20 years. Marital issues are a big stressor. Pt stated they lost a child and do not treat each other well. "I look for other men. I like the attention."   Compliant with medications. Pt spoke with husband on phone and remained calm. Maintained on 15 minute checks and observation by security camera for safety.

## 2017-01-22 NOTE — ED Notes (Signed)

## 2017-01-22 NOTE — ED Notes (Signed)
[facility]

## 2017-01-22 NOTE — BH Assessment (Signed)
Referral information for Psychiatric Hospitalization faxed to;   . [facility] ([phone])  . [facility] ([phone])  . [facility] ([phone])  . [facility] ([phone])  . [facility] ([phone])  . [facility] ([phone])  . [facility] ([phone]),   . [facility] ([phone]).  . [facility] ([phone])  

## 2017-01-22 NOTE — ED Notes (Signed)
Pt. Alert and oriented, warm and dry, in no distress. Pt. Denies SI, HI, and AVH. Pt. Encouraged to let nursing staff know of any concerns or needs. 

## 2017-01-22 NOTE — BH Assessment (Addendum)
Assessment Note  [name] is an 42 y.o. female who presents to the ED endorsing suicidal ideations. She reports, "Last night I had a break down and realized that I'm ashamed of myself. I still want to die. I don't know how to go on anymore. I hit rock bottom last night." Pt reports her husband found out about her infidelity and "he's been telling me to leave and get out because I don't want to do anything". Pt reports her current stressors are "life it sucks, my marriage, my car got towed, I've been seeking other men and I'm horrible for that". 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 She reports her on to have been stressful due to learning a new position.   She reports she would overdose on pills if she had to think of a plan to end her life. She reports having 1 previous attempt two years ago where she took "a hand full of my Effexor or Gabapentin". Pt dranks 3 beers daily and drank last night prior to presenting to ED - and has drank daily - "I've been drinking a lot". She states she began drinking at the age of 66. She also reports recent marijuana use - date of last use: 01/21/17. She denied use of other drugs. She reports cutting behaviors to left arm; last time being "a month ago ... It makes me feel better".  She is currently receiving services with [facility] - Dr. Marjo Bicker for medication management. She reports her current prescriptions to be Trazodone, Prozac, and Gabapentin. Pt reports these medications to be ineffective. She was unable to recall her last appointment with this provider. She denied having concerns with her sleep patterns. She shared that her appetite has been poor due to "When I'm nervous I throw up and I've been making myself throw up because I'm too fat". She denied HI/Hallucinations/Delusions.     Diagnosis:  Major Depressive Disorder, recurrent episode Alcohol Use Disorder, severe Cannabis Use Disorder,  mild Unspecified Borderline Personality Disorder  Past Medical History:  Past Medical History:  Diagnosis Date  . Depression   . Mental disorder     Past Surgical History:  Procedure Laterality Date  . ABDOMINAL HYSTERECTOMY    . CHOLECYSTECTOMY      Family History: History reviewed. No pertinent family history.  Social History:  reports that she has been smoking.  She has a 16.00 pack-year smoking history. she has never used smokeless tobacco. She reports that she drinks alcohol. She reports that she does not use drugs.  Additional Social History:  Alcohol / Drug Use Pain Medications: See PTA Prescriptions: See PTA Over the Counter: See PTA History of alcohol / drug use?: Yes Longest period of sobriety (when/how long): "in the past" Negative Consequences of Use: Financial, Personal relationships Withdrawal Symptoms: Irritability Substance #1 Name of Substance 1: Alcohol 1 - Age of First Use: 14 1 - Amount (size/oz): "3 beers a day" 1 - Frequency: daily 1 - Duration: years 1 - Last Use / Amount: 01/21/2017 Substance #2 Name of Substance 2: Marijuauna 2 - Age of First Use: 19 2 - Amount (size/oz): "1-2 hits of joint" 2 - Frequency: occasional 2 - Duration: "not that long" 2 - Last Use / Amount: 01/21/2017  CIWA: CIWA-Ar BP: 107/70 Pulse Rate: 70 Nausea and Vomiting: no nausea and no vomiting Tactile Disturbances: none Tremor: no tremor Auditory Disturbances: not present Paroxysmal Sweats: no sweat visible Visual Disturbances: not present Anxiety:  mildly anxious Headache, Fullness in Head: none present Agitation: normal activity Orientation and Clouding of Sensorium: oriented and can do serial additions CIWA-Ar Total: 1 COWS:    Allergies: No Known Allergies  Home Medications:  (Not in a hospital admission)  OB/GYN Status:  No LMP recorded. Patient has had a hysterectomy.  General Assessment Data Location of Assessment: [facility] ED TTS Assessment: In  system Is this a Tele or Face-to-Face Assessment?: Face-to-Face Is this an Initial Assessment or a Re-assessment for this encounter?: Initial Assessment Marital status: Married(20 years) [name] Fairly name: [name] Is patient pregnant?: No Pregnancy Status: No Living Arrangements: Spouse/significant other Can pt return to current living arrangement?: ([facility] - Pt reports he husband wants her to leave.) Admission Status: Involuntary Is patient capable of signing voluntary admission?: No Referral Source: Self/Family/Friend Insurance type: None  Medical Screening Exam [facility] Walk-in ONLY) Medical Exam completed: Yes  Crisis Care Plan Living Arrangements: Spouse/significant other Legal Guardian: Other:(Self) Name of Psychiatrist: Dr. Marjo Bicker Name of Therapist: [facility]  Education Status Is patient currently in school?: No Current Grade: N/A Highest grade of school patient has completed: [employer] in [employer] Name of school: N/A Contact person: N/A  Risk to self with the past 6 months Suicidal Ideation: Yes-Currently Present Has patient been a risk to self within the past 6 months prior to admission? : Yes Suicidal Intent: Yes-Currently Present Has patient had any suicidal intent within the past 6 months prior to admission? : Yes Is patient at risk for suicide?: Yes Suicidal Plan?: Yes-Currently Present(Passive - pt states she would take pills) Has patient had any suicidal plan within the past 6 months prior to admission? : No Specify Current Suicidal Plan: To take pills Access to Means: Yes Specify Access to Suicidal Means: Prescriptions What has been your use of drugs/alcohol within the last 12 months?: Alcohol, marijuana Previous Attempts/Gestures: Yes How many times?: 1 Other Self Harm Risks: Cutting Triggers for Past Attempts: Spouse contact Intentional Self Injurious Behavior: Cutting Comment - Self Injurious Behavior: To left arm - a month ago Family Suicide History: No Recent stressful life  event(s): Conflict (Comment), Loss (Comment), Financial Problems, Other (Comment)("my car was towed, infedelity) Persecutory voices/beliefs?: No Depression: Yes Depression Symptoms: Tearfulness, Isolating, Guilt, Feeling worthless/self pity, Loss of interest in usual pleasures, Fatigue Substance abuse history and/or treatment for substance abuse?: Yes Suicide prevention information given to non-admitted patients: Yes  Risk to Others within the past 6 months Homicidal Ideation: No Does patient have any lifetime risk of violence toward others beyond the six months prior to admission? : No Thoughts of Harm to Others: No Current Homicidal Intent: No Current Homicidal Plan: No Access to Homicidal Means: No Identified Victim: N/A History of harm to others?: No Assessment of Violence: None Noted Violent Behavior Description: N/A Does patient have access to weapons?: No Criminal Charges Pending?: Yes Describe Pending Criminal Charges: Traffic violations Does patient have a court date: Yes Court Date: 02/17/17 Is patient on probation?: Unknown  Psychosis Hallucinations: None noted Delusions: None noted  Mental Status Report Appearance/Hygiene: In scrubs, In hospital gown Eye Contact: Poor Motor Activity: Freedom of movement Speech: Logical/coherent Level of Consciousness: Alert Mood: Depressed, Helpless, Guilty, Ashamed/humiliated Affect: Depressed, Flat Anxiety Level: Severe Thought Processes: Coherent, Relevant Judgement: Unimpaired Orientation: Person, Place, Time, Situation, Appropriate for developmental age Obsessive Compulsive Thoughts/Behaviors: None  Cognitive Functioning Concentration: Normal Memory: Recent Intact, Remote Intact IQ: Average Insight: Fair Impulse Control: Fair Appetite: Good Weight Loss: 0 Weight Gain: 0 Sleep: No Change Total  Hours of Sleep: 10 Vegetative Symptoms: Staying in bed  ADLScreening [facility]) Patient's cognitive  ability adequate to safely complete daily activities?: Yes Patient able to express need for assistance with ADLs?: Yes Independently performs ADLs?: Yes (appropriate for developmental age)  Prior Inpatient Therapy Prior Inpatient Therapy: Yes Prior Therapy Dates: [facility] Reason for Treatment: Depression  Prior Outpatient Therapy Prior Outpatient Therapy: Yes Prior Therapy Dates: Current Prior Therapy Facilty/Provider(s): [facility] Reason for Treatment: Depression Does patient have an ACCT team?: No Does patient have Intensive In-House Services?  : No Does patient have [facility] services? : No Does patient have P4CC services?: No  ADL Screening (condition at time of admission) Patient's cognitive ability adequate to safely complete daily activities?: Yes Patient able to express need for assistance with ADLs?: Yes Independently performs ADLs?: Yes (appropriate for developmental age)       Abuse/Neglect Assessment (Assessment to be complete while patient is alone) Abuse/Neglect Assessment Can Be Completed: Yes Physical Abuse: Denies Verbal Abuse: Denies Sexual Abuse: Denies Exploitation of patient/patient's resources: Denies Self-Neglect: Denies Values / Beliefs Cultural Requests During Hospitalization: None Spiritual Requests During Hospitalization: None Consults Spiritual Care Consult Needed: No Social Work Consult Needed: No Merchant navy officerAdvance Directives (For Healthcare) Does Patient Have a Medical Advance Directive?: No    Additional Information 1:1 In Past 12 Months?: No CIRT Risk: No Elopement Risk: No Does patient have medical clearance?: Yes  Child/Adolescent Assessment Running Away Risk: (Patient is an adult)  Disposition:  Disposition Initial Assessment Completed for this Encounter: Yes Disposition of Patient: Inpatient treatment program(Per SOC recommendation) Type of inpatient treatment program: Adult  On Site Evaluation by:    Reviewed with Physician:    Wilmon ArmsSTEVENSON, Sephira Zellman 01/22/2017 10:05 AM

## 2017-01-23 NOTE — ED Notes (Signed)
Pt. Alert and oriented, warm and dry, in no distress. Pt. Denies SI, HI, and AVH. [facility]. This Clinical research associatewriter advised she should be going tomorrow, once transportation is set up. Pt. Encouraged to let nursing staff know of any concerns or needs.

## 2017-01-23 NOTE — ED Provider Notes (Signed)
-----------------------------------------   7:40 AM on 01/23/2017 -----------------------------------------   Blood pressure 119/84, pulse 64, temperature (!) 97.5 F (36.4 C), temperature source Oral, resp. rate 16, height 5\' 7"  (1.702 m), weight 97.5 kg (215 lb), SpO2 98 %.  The patient had no acute events since last update.  Calm and cooperative at this time.  Disposition is pending Psychiatry/Behavioral Medicine team recommendations.     Irean HongSung, Jemima Petko J, MD 01/23/17 [phone]

## 2017-01-23 NOTE — ED Notes (Signed)

## 2017-01-23 NOTE — ED Notes (Signed)
Pt IVC pending [facility]

## 2017-01-23 NOTE — ED Notes (Signed)
Patient alert and oriented. Patient states she is depressed and things not well with her right now. Patient denies SI/HI and A/V hallucinations at this time. Support and encouragement offered. Q 15 minute checks in progress and patient remains safe on unit.

## 2017-01-23 NOTE — BH Assessment (Addendum)
[facility]  Patient assigned to room 301-2 Accepting physician is Dr. Lucianne MussKumar.  Attending physician is Dr. Jama Flavorsobos Call report to [phone].  Representative was [facility].  ER Staff is aware of it Misty Stanley(Lisa, ER Sect.; Dr. Darnelle CatalanMalinda, ER MD & Britta MccreedyBarbara, Patient's Nurse)    Patient's Family/Support System [facility](John Hinger-husband, [phone]) have been updated as well.  Due to [place], [facility]. [facility] has been made aware and stated the bed can be held for 24 hours.

## 2017-01-24 ENCOUNTER — Inpatient Hospital Stay (HOSPITAL_COMMUNITY)
Admit: 2017-01-24 | Discharge: 2017-01-27 | DRG: 885 | Disposition: A | Discharge status: Home / Self Care | Payer: Federal, State, Local not specified - Other | Source: Intra-hospital | Attending: Psychiatry | Admitting: Psychiatry

## 2017-01-24 ENCOUNTER — Other Ambulatory Visit: Payer: Self-pay

## 2017-01-24 ENCOUNTER — Encounter (HOSPITAL_COMMUNITY): Payer: Self-pay | Admitting: General Practice

## 2017-01-24 DIAGNOSIS — F319 Bipolar disorder, unspecified: Secondary | ICD-10-CM | POA: Diagnosis not present

## 2017-01-24 DIAGNOSIS — Z915 Personal history of self-harm: Secondary | ICD-10-CM

## 2017-01-24 DIAGNOSIS — F3181 Bipolar II disorder: Secondary | ICD-10-CM

## 2017-01-24 DIAGNOSIS — F3189 Other bipolar disorder: Secondary | ICD-10-CM | POA: Diagnosis present

## 2017-01-24 DIAGNOSIS — F17210 Nicotine dependence, cigarettes, uncomplicated: Secondary | ICD-10-CM | POA: Diagnosis present

## 2017-01-24 DIAGNOSIS — F6089 Other specific personality disorders: Secondary | ICD-10-CM | POA: Diagnosis present

## 2017-01-24 DIAGNOSIS — F419 Anxiety disorder, unspecified: Secondary | ICD-10-CM | POA: Diagnosis present

## 2017-01-24 DIAGNOSIS — F1090 Alcohol use, unspecified, uncomplicated: Secondary | ICD-10-CM | POA: Diagnosis present

## 2017-01-24 DIAGNOSIS — R45851 Suicidal ideations: Secondary | ICD-10-CM | POA: Diagnosis present

## 2017-01-24 DIAGNOSIS — F1010 Alcohol abuse, uncomplicated: Secondary | ICD-10-CM | POA: Diagnosis present

## 2017-01-24 DIAGNOSIS — R451 Restlessness and agitation: Secondary | ICD-10-CM

## 2017-01-24 DIAGNOSIS — R450 Nervousness: Secondary | ICD-10-CM | POA: Diagnosis not present

## 2017-01-24 DIAGNOSIS — F39 Unspecified mood [affective] disorder: Secondary | ICD-10-CM

## 2017-01-24 DIAGNOSIS — F502 Bulimia nervosa: Secondary | ICD-10-CM | POA: Diagnosis present

## 2017-01-24 DIAGNOSIS — F1290 Cannabis use, unspecified, uncomplicated: Secondary | ICD-10-CM

## 2017-01-24 DIAGNOSIS — R632 Polyphagia: Secondary | ICD-10-CM | POA: Diagnosis present

## 2017-01-24 DIAGNOSIS — F329 Major depressive disorder, single episode, unspecified: Secondary | ICD-10-CM | POA: Diagnosis present

## 2017-01-24 HISTORY — DX: Anxiety disorder, unspecified: F41.9

## 2017-01-24 HISTORY — DX: Bipolar disorder, unspecified: F31.9

## 2017-01-24 MED ORDER — VITAMIN B-1 100 MG PO TABS
100.0000 mg | ORAL_TABLET | Freq: Every day | ORAL | Status: DC
  Administered 2017-01-25 – 2017-01-27 (×3): 100 mg via ORAL
  Filled 2017-01-24 (×5): qty 1

## 2017-01-24 MED ORDER — ADULT MULTIVITAMIN W/MINERALS CH
1.0000 | ORAL_TABLET | Freq: Every day | ORAL | Status: DC
  Administered 2017-01-24 – 2017-01-27 (×4): 1 via ORAL
  Filled 2017-01-24 (×7): qty 1

## 2017-01-24 MED ORDER — LORAZEPAM 1 MG PO TABS
1.0000 mg | ORAL_TABLET | Freq: Four times a day (QID) | ORAL | Status: DC | PRN
  Administered 2017-01-24 – 2017-01-26 (×3): 1 mg via ORAL
  Filled 2017-01-24 (×3): qty 1

## 2017-01-24 MED ORDER — LORAZEPAM 1 MG PO TABS: 1.0000 mg | ORAL_TABLET | Freq: Four times a day (QID) | ORAL | Status: DC | PRN

## 2017-01-24 MED ORDER — ARIPIPRAZOLE 10 MG PO TABS
10.0000 mg | ORAL_TABLET | Freq: Every day | ORAL | Status: DC
  Administered 2017-01-24 – 2017-01-26 (×3): 10 mg via ORAL
  Filled 2017-01-24 (×7): qty 1

## 2017-01-24 MED ORDER — ALUM & MAG HYDROXIDE-SIMETH 200-200-20 MG/5ML PO SUSP: 30.0000 mL | ORAL | Status: DC | PRN

## 2017-01-24 MED ORDER — HYDROXYZINE HCL 25 MG PO TABS: 25.0000 mg | ORAL_TABLET | Freq: Three times a day (TID) | ORAL | Status: DC | PRN

## 2017-01-24 MED ORDER — NICOTINE 21 MG/24HR TD PT24
21.0000 mg | MEDICATED_PATCH | Freq: Every day | TRANSDERMAL | Status: DC
  Administered 2017-01-24 – 2017-01-26 (×3): 21 mg via TRANSDERMAL
  Filled 2017-01-24 (×5): qty 1

## 2017-01-24 MED ORDER — MAGNESIUM HYDROXIDE 400 MG/5ML PO SUSP: 30.0000 mL | Freq: Every day | ORAL | Status: DC | PRN

## 2017-01-24 MED ORDER — LOPERAMIDE HCL 2 MG PO CAPS: 2.0000 mg | ORAL_CAPSULE | ORAL | Status: DC | PRN

## 2017-01-24 MED ORDER — ACETAMINOPHEN 325 MG PO TABS: 650.0000 mg | ORAL_TABLET | Freq: Four times a day (QID) | ORAL | Status: DC | PRN

## 2017-01-24 MED ORDER — HYDROXYZINE HCL 25 MG PO TABS
25.0000 mg | ORAL_TABLET | Freq: Four times a day (QID) | ORAL | Status: DC | PRN
  Administered 2017-01-24 – 2017-01-26 (×3): 25 mg via ORAL
  Filled 2017-01-24: qty 1
  Filled 2017-01-24: qty 10
  Filled 2017-01-24 (×2): qty 1

## 2017-01-24 NOTE — Tx Team (Signed)
Initial Treatment Plan 01/24/2017 10:15 PM Shelia HopesMargaret Wynes ZOX:[id number]    PATIENT STRESSORS: Marital or family conflict Medication change or noncompliance Substance abuse   PATIENT STRENGTHS: Capable of independent living General fund of knowledge Motivation for treatment/growth   PATIENT IDENTIFIED PROBLEMS: "help with drinking "  "help with depression"                   DISCHARGE CRITERIA:  Improved stabilization in mood, thinking, and/or behavior Reduction of life-threatening or endangering symptoms to within safe limits Verbal commitment to aftercare and medication compliance  PRELIMINARY DISCHARGE PLAN: Attend aftercare/continuing care group Attend 12-step recovery group Participate in family therapy  PATIENT/FAMILY INVOLVEMENT: This treatment plan has been presented to and reviewed with the patient, Shelia HopesMargaret Terry, and/or family member, .  The patient and family have been given the opportunity to ask questions and make suggestions.  Andrena Mewsuttall, Abraham Margulies J, RN 01/24/2017, 10:15 PM

## 2017-01-24 NOTE — B[facility] Group Notes (Signed)
[facility] Group Notes: Therapeutic Relaxation Group   Date:  01/24/2017  Time:  4:36 PM  Type of Therapy:  Therapeutic Relaxation Group- Bingo   Participation Level:  Active  Participation Quality:  Appropriate and Attentive  Affect:  Blunted  Cognitive:  Alert and Appropriate  Insight:  Improving  Engagement in Group:  Engaged  Modes of Intervention:  Activity, Rapport Building, Socialization and Support  Summary of Progress/Problems: Patient attended group, initially she did not participate but with some encouragement she decided to play and won a game. She received a prize for her win.   Marzetta BoardDopson, Kimberlie Csaszar E 01/24/2017, 4:36 PM

## 2017-01-24 NOTE — [facility] Group Notes (Signed)
[facility] Therapy 01/24/2017 1:15pm  Type of Therapy: Mental Health Association Presentation  Participation Level: Active  Participation Quality: Attentive  Affect: Appropriate  Cognitive: Oriented  Insight: Developing/Improving  Engagement in Therapy: Engaged  Modes of Intervention: Discussion, Education and Socialization  Summary of Progress/Problems: Mental Health Association (MHA) Speaker came to talk about his personal journey with mental health. The pt processed ways by which to relate to the speaker. MHA speaker provided handouts and educational information pertaining to groups and services offered by the [facility]. Pt was engaged in speaker's presentation and was receptive to resources provided.   Pulte HomesHeather N Smart, LCSW 01/24/2017 1:24 PM

## 2017-01-24 NOTE — ED Provider Notes (Signed)
-----------------------------------------   7:39 AM on 01/24/2017 -----------------------------------------   BP (!) 128/93 (BP Location: Right Arm)   Pulse 62   Temp 97.7 F (36.5 C) (Oral)   Resp 16   Ht 1.702 m (5\' 7" )   Wt 97.5 kg (215 lb)   SpO2 100%   BMI 33.67 kg/m   No acute events overnight. Vitals reviewed. Patient remains medically cleared.  Disposition is pending per Psychiatry/Behavioral Medicine team recommendations.    Jene EveryKinner, Yoandri Congrove, MD 01/24/17 [phone]
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
## 2017-01-24 NOTE — H&P (Signed)
Psychiatric Admission Assessment Adult  Patient Identification: [name] MRN:  [id number] Date of Evaluation:  01/24/2017 Chief Complaint: " I knew I hit my rock bottom" Principal Diagnosis: MDD, no psychotic symptoms, Alcohol Use Disorder  Diagnosis:   Patient Active Problem List   Diagnosis Date Noted  . MDD (major depressive disorder) [F32.9] 01/24/2017  . Severe episode of recurrent major depressive disorder, without psychotic features (HCC) [F33.2]   . Severe recurrent major depression without psychotic features (HCC) [F33.2] 10/30/2014  . Alcohol abuse [F10.10] 10/30/2014  . 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 Bipolar affective disorder (HCC) [F31.9] 09/06/2011   History of Present Illness:[age] year old married female. Presented to ED on 1/19 for depression, alcohol abuse, suicidal ideations of overdosing .  Reports she felt like " I hit rock bottom" and asked her husband to bring her to ED. Describes alcohol use disorder, and reports she  has been drinking daily , generally 3-4 high alcohol content beers, sometimes a bottle of wine per day.  Admission BAL 23, admission UDS positive for cannabis. She also reports history of impulsive behaviors including overspending, impulsive sexual activity often in the context of alcohol intoxications , breaking items at home such as plates, dishes, when feeling upset . These issues  have caused significant sense of self shame and  marital strain . She also endorses eating disorder symptoms, mainly bulimia, with episodes of binge eating and self induced vomiting .   Associated Signs/Symptoms: Depression Symptoms:  depressed mood, anhedonia, suicidal thoughts with specific plan, anxiety, loss of energy/fatigue, decreased appetite, reports she has lost about 15 lbs over recent weeks to months  (Hypo) Manic Symptoms:  Impulsivity Anxiety Symptoms:  Reports increased anxiety  Psychotic Symptoms:  Denies  PTSD  Symptoms: Denies  Total Time spent with patient: 45 minutes  Past Psychiatric History:  Reports history of several prior psychiatric admissions starting a few years ago, most recently two years ago. One prior suicidal attempt two years ago by overdosing . History of self cutting. States she stopped about one month ago. Denies history of psychosis. Has been diagnosed with Bipolar Disorder and with MDD in the past. She does report significant mood swings and mood instability. Reports history of Bulimic behaviors- see above, x several months, and in the past when in college.    Is the patient at risk to self? Yes.    Has the patient been a risk to self in the past 6 months? Yes.    Has the patient been a risk to self within the distant past? Yes.    Is the patient a risk to others? No.  Has the patient been a risk to others in the past 6 months? No.  Has the patient been a risk to others within the distant past? No.   Prior Inpatient Therapy:  as above  Prior Outpatient Therapy:  [facility] [place], does not currently have a therapist .   Alcohol Screening:   Substance Abuse History in the last 12 months:  Reports alcohol abuse, reports cannabis abuse  Consequences of Substance Abuse: Increased impulsivity, relationship stressors , denies history of WDL seizures, (+) history of occasional blackouts, history of DUI  Previous Psychotropic Medications: Most recently has been on Prozac, Neurontin, Trazodone , x 8 months . In the past has been Abilify, Effexor XR, Wellbutrin SR.  Psychological Evaluations:  No  Past Medical History: denies medical illnesses  Past Medical History:  Diagnosis Date  .  Depression   . Mental disorder     Past Surgical History:  Procedure Laterality Date  . ABDOMINAL HYSTERECTOMY    . CHOLECYSTECTOMY     Family History:father is deceased from melanoma in his 71s, mother is alive. Has two sisters Family Psychiatric  History:   Denies history of psychiatric illness in family. Tobacco Screening:  smokes 1 PPD  Social History: 42 year old married female, lives with husband and youngest daughter  , has two surviving  children ( one of her children  passed away from cancer 06/11/14). She is employed .  Social History   Substance and Sexual Activity  Alcohol Use Yes   Comment: 48 oz beer qod     Social History   Substance and Sexual Activity  Drug Use No    Additional Social History:  Allergies:  No Known Allergies Lab Results: No results found for this or any previous visit (from the past 48 hour(s)).  Blood Alcohol level:  Lab Results  Component Value Date   ETH 23 (H) 01/21/2017   ETH <5 10/29/2014    Metabolic Disorder Labs:  Lab Results  Component Value Date   HGBA1C 5.4 10/31/2014   No results found for: PROLACTIN Lab Results  Component Value Date   CHOL 283 (H) 10/31/2014   TRIG 160 (H) 10/31/2014   HDL 52 10/31/2014   CHOLHDL 5.4 10/31/2014   VLDL 32 10/31/2014   LDLCALC 199 (H) 10/31/2014   LDLCALC 213 (H) 03/17/2013    Current Medications: Current Facility-Administered Medications  Medication Dose Route Frequency Provider Last Rate Last Dose  . acetaminophen (TYLENOL) tablet 650 mg  650 mg Oral Q6H PRN Oneta Rack, NP      . alum & mag hydroxide-simeth (MAALOX/MYLANTA) 200-200-20 MG/5ML suspension 30 mL  30 mL Oral Q4H PRN Oneta Rack, NP      . hydrOXYzine (ATARAX/VISTARIL) tablet 25 mg  25 mg Oral TID PRN Oneta Rack, NP      . magnesium hydroxide (MILK OF MAGNESIA) suspension 30 mL  30 mL Oral Daily PRN Oneta Rack, NP       PTA Medications: Medications Prior to Admission  Medication Sig Dispense Refill Last Dose  . amoxicillin (AMOXIL) 500 MG tablet Take 1 tablet (500 mg total) by mouth 2 (two) times daily. 20 tablet 0   . ARIPiprazole (ABILIFY) 10 MG tablet Take 1 tablet (10 mg total) by mouth daily. 30 tablet 0 08/03/2016 at Unknown time  . buPROPion (WELLBUTRIN  SR) 150 MG 12 hr tablet Take 1 tablet (150 mg total) by mouth 2 (two) times daily. 60 tablet 0 08/03/2016 at Unknown time  . FLUoxetine (PROZAC) 10 MG tablet Take 10 mg by mouth daily.     Marland Kitchen gabapentin (NEURONTIN) 300 MG capsule Take 1 capsule (300 mg total) by mouth 2 (two) times daily. 60 capsule 0 08/02/2016 at Unknown time  . traZODone (DESYREL) 50 MG tablet Take 50 mg by mouth at bedtime.     Marland Kitchen venlafaxine XR (EFFEXOR-XR) 75 MG 24 hr capsule Take 3 capsules (225 mg total) by mouth daily with breakfast. 90 capsule 0 08/03/2016 at Unknown time    Musculoskeletal: Strength & Muscle Tone: within normal limits Gait & Station: normal Patient leans: N/A  Psychiatric Specialty Exam: Physical Exam  Review of Systems  Constitutional: Positive for chills.  HENT: Negative.   Eyes: Negative.   Respiratory: Negative.   Cardiovascular: Negative.   Gastrointestinal: Negative.   Genitourinary: Negative.  Musculoskeletal: Negative.   Skin: Negative.   Endo/Heme/Allergies: Negative.   Psychiatric/Behavioral: Positive for depression, substance abuse and suicidal ideas.  All other systems reviewed and are negative.   Blood pressure 119/71, pulse 74, temperature 98.7 F (37.1 C), temperature source Oral, resp. rate 18, height 5\' 7"  (1.702 m), weight 79.6 kg (175 lb 8 oz).Body mass index is 27.49 kg/m.  General Appearance: Fairly Groomed  Eye Contact:  Fair  Speech:  Normal Rate  Volume:  Normal  Mood:  depressed, dysphoric   Affect:  constricted, labile   Thought Process:  Linear and Descriptions of Associations: Intact  Orientation:  Other:  fully alert and attentive , fully oriented x 3  Thought Content:  no hallucinations, no delusions, not internally preoccupied   Suicidal Thoughts:  No denies current suicidal or self injurious ideations,and contracts for safety on unit   Homicidal Thoughts:  No denies any homicidal or violent ideations  Memory:  recent and remote grossly intact    Judgement:  Fair  Insight:  Fair  Psychomotor Activity:  Normal- no current tremors or restlessness , does not appear to be in any acute distress   Concentration:  Concentration: Fair and Attention Span: Fair  Recall:  Good  Fund of Knowledge:  Good  Language:  Good  Akathisia:  Negative  Handed:  Right  AIMS (if indicated):     Assets:  Communication Skills Desire for Improvement Resilience  ADL's:  Intact  Cognition:  WNL  Sleep:       Treatment Plan Summary: Daily contact with patient to assess and evaluate symptoms and progress in treatment, Medication management, Plan inpatient treatment  and medications as below  Observation Level/Precautions:  15 minute checks  Laboratory:  as needed  check TSH, HgbA1C, Lipid Panel . Based on report of Bulilmic behaviors, will repeat BMP to monitor electrolytes   Psychotherapy: milieu, group therapy    Medications:  Based on worsening symptoms in spite of SSRI ( Prozac trial ) and history /presentation suggestive of Bipolar Spectrum Disorder will D/C Prozac and start mood stabilizer. We discussed continuing, restarting Neurontin, but patient states she is unsure if it was helping .  Start Abilify 10 mgrs QDAY   Consultations:  As needed   Discharge Concerns:  -   Estimated LOS: 5-6 days   Other:     Physician Treatment Plan for Primary Diagnosis: Bipolar Disorder, Mixed  Long Term Goal(s): Improvement in symptoms so as ready for discharge  Short Term Goals: Ability to identify changes in lifestyle to reduce recurrence of condition will improve and Ability to maintain clinical measurements within normal limits will improve  Physician Treatment Plan for Secondary Diagnosis: Alcohol Use Disorder  Long Term Goal(s): Improvement in symptoms so as ready for discharge  Short Term Goals: Ability to identify triggers associated with substance abuse/mental health issues will improve  I certify that inpatient services furnished can reasonably  be expected to improve the patient's condition.    Craige Cotta, MD 1/22/20194:00 PM
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
## 2017-01-24 NOTE — BHH Suicide Risk Assessment (Signed)
[facility]   Nursing information obtained from:   patient and chart  Demographic factors:   42 year old married female, employed  Current Mental Status:   see below  Loss Factors:   daily alcohol consumption, marital strain  Historical Factors:   reports history of alcohol abuse, history of mood disorder, history of prior psychiatric admissions Risk Reduction Factors:   resilience  Total Time spent with patient: 45 minutes Principal Problem: Bipolar Disorder, with Mixed features, Alcohol Use Disorder  Diagnosis:   Patient Active Problem List   Diagnosis Date Noted  . MDD (major depressive disorder) [F32.9] 01/24/2017  . Severe episode of recurrent major depressive disorder, without psychotic features (HCC) [F33.2]   . Severe recurrent major depression without psychotic features (HCC) [F33.2] 10/30/2014  . Alcohol abuse [F10.10] 10/30/2014  . 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 Bipolar affective disorder (HCC) [F31.9] 09/06/2011    Continued Clinical Symptoms:    The "Alcohol Use Disorders Identification Test", Guidelines for Use in Primary Care, Second Edition.  World Science writerHealth Organization [facility](WHO). Score between 0-7:  no or low risk or alcohol related problems. Score between 8-15:  moderate risk of alcohol related problems. Score between 16-19:  high risk of alcohol related problems. Score 20 or above:  warrants further diagnostic evaluation for alcohol dependence and treatment.   CLINICAL FACTORS:   42 year old married female, presented to ED due to feeling depressed, suicidal and feeling she had hit " rock bottom". In addition to depression, she also presents with and endorses vague irritability, increased impulsivity /impulsive behaviors such as sexual indiscretions and overspending . Presents dysphoric, vaguely irritable . No current symptoms of WDL.    Psychiatric Specialty Exam: Physical Exam  ROS  Blood pressure 119/71,  pulse 74, temperature 98.7 F (37.1 C), temperature source Oral, resp. rate 18, height 5\' 7"  (1.702 m), weight 79.6 kg (175 lb 8 oz).Body mass index is 27.49 kg/m.   see admit note MSE   COGNITIVE FEATURES THAT CONTRIBUTE TO RISK:  Closed-mindedness and Loss of executive function    SUICIDE RISK:   Moderate:  Frequent suicidal ideation with limited intensity, and duration, some specificity in terms of plans, no associated intent, good self-control, limited dysphoria/symptomatology, some risk factors present, and identifiable protective factors, including available and accessible social support.  PLAN OF CARE: Patient will be admitted to inpatient psychiatric unit for stabilization and safety. Will provide and encourage milieu participation. Provide medication management and maked adjustments as needed. Will also provide medication management to minimize risk of alcohol WDL.  Will follow daily.    I certify that inpatient services furnished can reasonably be expected to improve the patient's condition.   Craige CottaFernando A Deserai Cansler, MD 01/24/2017, 4:47 PM

## 2017-01-24 NOTE — ED Notes (Signed)
Patient alert and oriented. Patient is tearful this morning and crying. Patient states she wants to get help. Patient states she has been depressed and sad for a while. Patient provided support and encouragement. Patient with Q 15 minute checks in progress and remains safe on unit. Monitoring of patient continues.

## 2017-01-24 NOTE — ED Notes (Signed)
IVC/[facility] on [facility]

## 2017-01-24 NOTE — ED Notes (Signed)
[facility] [place]. Report called to [employer] International[employer]a RN. Patient alert and oriented. Patient AVS/ reviewed and copy given to patient. Patient verbalized understanding. Patient aware of [facility] to [facility] and agrees to admission. Patient voices no concerns or questions. Patient transported by [employer]. Patient denies pain. Patient vitals at discharge [id number]% room air.

## 2017-01-25 DIAGNOSIS — F319 Bipolar disorder, unspecified: Secondary | ICD-10-CM

## 2017-01-25 LAB — BASIC METABOLIC PANEL
Anion gap: 9 (ref 5–15)
BUN: 12 mg/dL (ref 6–20)
CHLORIDE: 98 mmol/L — AB (ref 101–111)
CO2: 27 mmol/L (ref 22–32)
CREATININE: 0.75 mg/dL (ref 0.44–1.00)
Calcium: 9.1 mg/dL (ref 8.9–10.3)
GFR calc Af Amer: >60 mL/min (ref >60)
GFR calc non Af Amer: >60 mL/min (ref >60)
Glucose, Bld: 123 mg/dL — ABNORMAL HIGH (ref 65–99)
POTASSIUM: 3.9 mmol/L (ref 3.5–5.1)
Sodium: 134 mmol/L — ABNORMAL LOW (ref 135–145)

## 2017-01-25 LAB — HEMOGLOBIN A1C
HEMOGLOBIN A1C: 5.3 % (ref 4.8–5.6)
Mean Plasma Glucose: 105.41 mg/dL

## 2017-01-25 LAB — GC/CHLAMYDIA PROBE AMP (~~LOC~~) NOT AT ARMC
CHLAMYDIA, DNA PROBE: NEGATIVE
NEISSERIA GONORRHEA: NEGATIVE

## 2017-01-25 LAB — LIPID PANEL
Cholesterol: 237 mg/dL — ABNORMAL HIGH (ref 0–200)
HDL: 50 mg/dL (ref >40)
LDL Cholesterol: 141 mg/dL — ABNORMAL HIGH (ref 0–99)
TRIGLYCERIDES: 231 mg/dL — AB (ref <150)
Total CHOL/HDL Ratio: 4.7 RATIO
VLDL: 46 mg/dL — ABNORMAL HIGH (ref 0–40)

## 2017-01-25 LAB — TSH: TSH: 2.549 u[IU]/mL (ref 0.350–4.500)

## 2017-01-25 LAB — HIV ANTIBODY (ROUTINE TESTING W REFLEX): HIV SCREEN 4TH GENERATION: NONREACTIVE

## 2017-01-25 LAB — RPR: RPR: NONREACTIVE

## 2017-01-25 MED ORDER — GABAPENTIN 100 MG PO CAPS
100.0000 mg | ORAL_CAPSULE | Freq: Three times a day (TID) | ORAL | Status: DC
  Administered 2017-01-25 – 2017-01-26 (×4): 100 mg via ORAL
  Filled 2017-01-25 (×9): qty 1

## 2017-01-25 NOTE — Progress Notes (Signed)
Patient ID: [name], female   DOB: 1975-04-20, 42 y.o.   MRN: [id number]  DAR: Pt. Denies HI and A/V Hallucinations. She endorses passive SI, she is able to contract for safety. She reports that her sleep was fair, her appetite is good, her energy level is low, and her concentration is poor. She rates her depression 7/10, her hopelessness 8/10, and her anxiety level 8/10. Shortly after taking her morning medications she report some nausea. She received some Ginger ale and reports that it has subsided. Her withdrawal symptoms include anxiety and chilling. Support and encouragement provided to the patient. Scheduled medications administered to patient per physician's orders. Patient is minimal but cooperative. She is seen in the milieu and is attending groups. Q15 minute checks are maintained for safety.

## 2017-01-25 NOTE — Tx Team (Signed)
Interdisciplinary Treatment and Diagnostic Plan Update  01/25/2017 Time of Session: 0830AM [name] MRN: [id number]  Principal Diagnosis: MDD, recurrent, severe  Secondary Diagnoses: Active Problems:   MDD (major depressive disorder)   Current Medications:  Current Facility-Administered Medications  Medication Dose Route Frequency Provider Last Rate Last Dose  . acetaminophen (TYLENOL) tablet 650 mg  650 mg Oral Q6H PRN Oneta Rack, NP      . alum & mag hydroxide-simeth (MAALOX/MYLANTA) 200-200-20 MG/5ML suspension 30 mL  30 mL Oral Q4H PRN Oneta Rack, NP      . ARIPiprazole (ABILIFY) tablet 10 mg  10 mg Oral Daily Cobos, Rockey Situ, MD   10 mg at 01/25/17 0746  . hydrOXYzine (ATARAX/VISTARIL) tablet 25 mg  25 mg Oral Q6H PRN Cobos, Rockey Situ, MD   25 mg at 01/24/17 2124  . loperamide (IMODIUM) capsule 2-4 mg  2-4 mg Oral PRN Cobos, Rockey Situ, MD      . LORazepam (ATIVAN) tablet 1 mg  1 mg Oral Q6H PRN Cobos, Rockey Situ, MD   1 mg at 01/24/17 2124  . magnesium hydroxide (MILK OF MAGNESIA) suspension 30 mL  30 mL Oral Daily PRN Oneta Rack, NP      . multivitamin with minerals tablet 1 tablet  1 tablet Oral Daily Cobos, Rockey Situ, MD   1 tablet at 01/25/17 0746  . nicotine (NICODERM CQ - dosed in mg/24 hours) patch 21 mg  21 mg Transdermal Daily Cobos, Rockey Situ, MD   21 mg at 01/25/17 0748  . thiamine (VITAMIN B-1) tablet 100 mg  100 mg Oral Daily Cobos, Rockey Situ, MD   100 mg at 01/25/17 0746   PTA Medications: Medications Prior to Admission  Medication Sig Dispense Refill Last Dose  . amoxicillin (AMOXIL) 500 MG tablet Take 1 tablet (500 mg total) by mouth 2 (two) times daily. 20 tablet 0   . ARIPiprazole (ABILIFY) 10 MG tablet Take 1 tablet (10 mg total) by mouth daily. 30 tablet 0 08/03/2016 at Unknown time  . buPROPion (WELLBUTRIN SR) 150 MG 12 hr tablet Take 1 tablet (150 mg total) by mouth 2 (two) times daily. 60 tablet 0 08/03/2016 at Unknown time  .  FLUoxetine (PROZAC) 10 MG tablet Take 10 mg by mouth daily.     Marland Kitchen gabapentin (NEURONTIN) 300 MG capsule Take 1 capsule (300 mg total) by mouth 2 (two) times daily. 60 capsule 0 08/02/2016 at Unknown time  . traZODone (DESYREL) 50 MG tablet Take 50 mg by mouth at bedtime.     Marland Kitchen venlafaxine XR (EFFEXOR-XR) 75 MG 24 hr capsule Take 3 capsules (225 mg total) by mouth daily with breakfast. 90 capsule 0 08/03/2016 at Unknown time    Patient Stressors: Marital or family conflict Medication change or noncompliance Substance abuse  Patient Strengths: Capable of independent living General fund of knowledge Motivation for treatment/growth  Treatment Modalities: Medication Management, Group therapy, Case management,  1 to 1 session with clinician, Psychoeducation, Recreational therapy.   Physician Treatment Plan for Primary Diagnosis: MDD, recurrent, severe Long Term Goal(s): Improvement in symptoms so as ready for discharge Improvement in symptoms so as ready for discharge   Short Term Goals: Ability to identify changes in lifestyle to reduce recurrence of condition will improve Ability to maintain clinical measurements within normal limits will improve Ability to identify triggers associated with substance abuse/mental health issues will improve  Medication Management: Evaluate patient's response, side effects, and tolerance of medication regimen.  Therapeutic Interventions: 1 to 1 sessions, Unit Group sessions and Medication administration.  Evaluation of Outcomes: Progressing  Physician Treatment Plan for Secondary Diagnosis: Active Problems:   MDD (major depressive disorder)  Long Term Goal(s): Improvement in symptoms so as ready for discharge Improvement in symptoms so as ready for discharge   Short Term Goals: Ability to identify changes in lifestyle to reduce recurrence of condition will improve Ability to maintain clinical measurements within normal limits will improve Ability to  identify triggers associated with substance abuse/mental health issues will improve     Medication Management: Evaluate patient's response, side effects, and tolerance of medication regimen.  Therapeutic Interventions: 1 to 1 sessions, Unit Group sessions and Medication administration.  Evaluation of Outcomes: Progressing   RN Treatment Plan for Primary Diagnosis: MDD, recurrent, severe Long Term Goal(s): Knowledge of disease and therapeutic regimen to maintain health will improve  Short Term Goals: Ability to remain free from injury will improve, Ability to verbalize feelings will improve, Ability to disclose and discuss suicidal ideas and Ability to identify and develop effective coping behaviors will improve  Medication Management: RN will administer medications as ordered by provider, will assess and evaluate patient's response and provide education to patient for prescribed medication. RN will report any adverse and/or side effects to prescribing provider.  Therapeutic Interventions: 1 on 1 counseling sessions, Psychoeducation, Medication administration, Evaluate responses to treatment, Monitor vital signs and CBGs as ordered, Perform/monitor CIWA, COWS, AIMS and Fall Risk screenings as ordered, Perform wound care treatments as ordered.  Evaluation of Outcomes: Progressing   LCSW Treatment Plan for Primary Diagnosis: MDD, recurrent, severe Long Term Goal(s): Safe transition to appropriate next level of care at discharge, Engage patient in therapeutic group addressing interpersonal concerns.  Short Term Goals: Engage patient in aftercare planning with referrals and resources, Facilitate patient progression through stages of change regarding substance use diagnoses and concerns and Identify triggers associated with mental health/substance abuse issues  Therapeutic Interventions: Assess for all discharge needs, 1 to 1 time with Social worker, Explore available resources and support  systems, Assess for adequacy in community support network, Educate family and significant other(s) on suicide prevention, Complete Psychosocial Assessment, Interpersonal group therapy.  Evaluation of Outcomes: Progressing   Progress in Treatment: Attending groups: Yes. Participating in groups: Yes. Taking medication as prescribed: Yes. Toleration medication: Yes. Family/Significant other contact made: No, will contact:  pt's mother Patient understands diagnosis: Yes. Discussing patient identified problems/goals with staff: Yes. Medical problems stabilized or resolved: Yes. Denies suicidal/homicidal ideation: Yes. Issues/concerns per patient self-inventory: No. Other: n/a   New problem(s) identified: No, Describe:  n/a  New Short Term/Long Term Goal(s): detox, medication management for mood stabilization; elimination of SI thoughts; development of comprehensive mental wellness/sobriety plan.   Patieng Goal:  "to get my medications right and reduce stress. I need to figure out where I want to go from here--probably to my mother's house."   Discharge Plan or Barriers: CSW assessing for appropriate referrals. [place] at [place] and plans [facility] [place]   Reason for Continuation of Hospitalization: Anxiety Depression Medication stabilization Suicidal ideation Withdrawal symptoms  Estimated Length of Stay: Friday, 01/27/17  Attendees: Patient: 01/25/2017 8:33 AM  Physician: Dr. Altamese Carolinaainville MD; Dr. Jama Flavorsobos MD 01/25/2017 8:33 AM  Nursing: Foy Guadalajarahrista RN; Jan RN 01/25/2017 8:33 AM  RN Care Manager:x 01/25/2017 8:33 AM  Social Worker: Trula SladeHeather Smart, LCSW 01/25/2017 8:33 AM  Recreational Therapist:  x 01/25/2017 8:33 AM  Other: Armandina Stammer NP; Feliz Beam Money NP 01/25/2017 8:33 AM  Other:  01/25/2017 8:33 AM  Other: 01/25/2017 8:33 AM    Scribe for Treatment Team: Ledell Peoples Smart, LCSW 01/25/2017 8:33 AM

## 2017-01-25 NOTE — Progress Notes (Signed)
[facility] MD Progress Note  01/25/2017 11:05 AM [name]  MRN:  [id number]   Subjective:  Patient reports that she feels that she feel better, but is still very depressed and still wonders if she would be better off not here at all. She then states that she does not want to kill or harm herself, "I'd probably just drink myself to death." Patient reports she still has anxiety, but denies any HI/AVH. She reports some nausea and feeling fidgety as well. She denies any medication side effects. She still feels like there has been an improvement already. She contracts for safety.   Objective: Patient's chart and findings reviewed and discussed with treatment team. Patient presents in the day room interacting with peers and staff. She has been attending groups and participating. Will start Gabapentin 100 mg TID, she reports that she used it in the past but was using antidepressants as well and that it may have interfered with her anxiety. She wishes to try it again for agitation and anxiety.  Principal Problem: Bipolar affective disorder [facility]) Diagnosis:   Patient Active Problem List   Diagnosis Date Noted  . MDD (major depressive disorder) [F32.9] 01/24/2017  . Severe episode of recurrent major depressive disorder, without psychotic features ([place]) [F33.2]   . Severe recurrent major depression without psychotic features ([place]) [F33.2] 10/30/2014  . Alcohol abuse [F10.10] 10/30/2014  . Cluster B personality disorder ([place]) [F60.9] 09/06/2014  . Bipolar affective disorder ([place]) [F31.9] 09/06/2011   Total Time spent with patient: 25 minutes  Past Psychiatric History: See H&P  Past Medical History:  Past Medical History:  Diagnosis Date  . Anxiety    per pt report   . Bipolar disorder ([place])    per pt report  . Depression   . Mental disorder     Past Surgical History:  Procedure Laterality Date  . ABDOMINAL HYSTERECTOMY    . CHOLECYSTECTOMY     Family History: History reviewed. No pertinent  family history. Family Psychiatric  History: See H&P Social History:  Social History   Substance and Sexual Activity  Alcohol Use Yes   Comment: 48 oz beer, 1 bottle of wine, close to a 1/5th of vodka      Social History   Substance and Sexual Activity  Drug Use Yes  . Types: Marijuana   Comment: THC- occ use     Social History   Socioeconomic History  . Marital status: Married    Spouse name: None  . Number of children: None  . Years of education: None  . Highest education level: None  Social Needs  . Financial resource strain: None  . Food insecurity - worry: None  . Food insecurity - inability: None  . Transportation needs - medical: None  . Transportation needs - non-medical: None  Occupational History  . None  Tobacco Use  . Smoking status: Current Every Day Smoker    Packs/day: 1.00    Years: 16.00    Pack years: 16.00  . Smokeless tobacco: Never Used  . Tobacco comment: given info about patches and 2 wk free supply after discharge  Substance and Sexual Activity  . Alcohol use: Yes    Comment: 48 oz beer, 1 bottle of wine, close to a 1/5th of vodka   . Drug use: Yes    Types: Marijuana    Comment: THC- occ use   . Sexual activity: Yes    Birth control/protection: Surgical  Other Topics Concern  . None  Social  History Narrative  . None   Additional Social History:                         Sleep: Good  Appetite:  Good  Current Medications: Current Facility-Administered Medications  Medication Dose Route Frequency Provider Last Rate Last Dose  . acetaminophen (TYLENOL) tablet 650 mg  650 mg Oral Q6H PRN Derrill Center, NP      . alum & mag hydroxide-simeth (MAALOX/MYLANTA) 200-200-20 MG/5ML suspension 30 mL  30 mL Oral Q4H PRN Derrill Center, NP      . ARIPiprazole (ABILIFY) tablet 10 mg  10 mg Oral Daily Teryn Boerema, Myer Peer, MD   10 mg at 01/25/17 0746  . gabapentin (NEURONTIN) capsule 100 mg  100 mg Oral TID Money, Lowry Ram, FNP      .  hydrOXYzine (ATARAX/VISTARIL) tablet 25 mg  25 mg Oral Q6H PRN Shaneca Orne, Myer Peer, MD   25 mg at 01/24/17 2124  . loperamide (IMODIUM) capsule 2-4 mg  2-4 mg Oral PRN Clorine Swing, Myer Peer, MD      . LORazepam (ATIVAN) tablet 1 mg  1 mg Oral Q6H PRN Shaquna Geigle, Myer Peer, MD   1 mg at 01/24/17 2124  . magnesium hydroxide (MILK OF MAGNESIA) suspension 30 mL  30 mL Oral Daily PRN Derrill Center, NP      . multivitamin with minerals tablet 1 tablet  1 tablet Oral Daily Ulysses Alper, Myer Peer, MD   1 tablet at 01/25/17 0746  . nicotine (NICODERM CQ - dosed in mg/24 hours) patch 21 mg  21 mg Transdermal Daily Terriana Barreras, Myer Peer, MD   21 mg at 01/25/17 0748  . thiamine (VITAMIN B-1) tablet 100 mg  100 mg Oral Daily Arnel Wymer, Myer Peer, MD   100 mg at 01/25/17 0746    Lab Results:  Results for orders placed or performed during the hospital encounter of 01/24/17 (from the past 48 hour(s))  Basic metabolic panel     Status: Abnormal   Collection Time: 01/25/17  7:25 AM  Result Value Ref Range   Sodium 134 (L) 135 - 145 mmol/L   Potassium 3.9 3.5 - 5.1 mmol/L   Chloride 98 (L) 101 - 111 mmol/L   CO2 27 22 - 32 mmol/L   Glucose, Bld 123 (H) 65 - 99 mg/dL   BUN 12 6 - 20 mg/dL   Creatinine, Ser 0.75 0.44 - 1.00 mg/dL   Calcium 9.1 8.9 - 10.3 mg/dL   GFR calc non Af Amer >60 >60 mL/min   GFR calc Af Amer >60 >60 mL/min    Comment: (NOTE) The eGFR has been calculated using the CKD EPI equation. This calculation has not been validated in all clinical situations. eGFR's persistently <60 mL/min signify possible Chronic Kidney Disease.    Anion gap 9 5 - 15    Comment: Performed at [facility], [place] [address], [place], [place] [address]  TSH     Status: None   Collection Time: 01/25/17  7:25 AM  Result Value Ref Range   TSH 2.549 0.350 - 4.500 uIU/mL    Comment: Performed by a 3rd Generation assay with a functional sensitivity of <=0.01 uIU/mL. Performed at [facility], [place] [address], [place], [place] [address]   Lipid panel     Status: Abnormal   Collection Time: 01/25/17  7:25 AM  Result Value Ref Range   Cholesterol 237 (H) 0 -  200 mg/dL   Triglycerides 231 (H) <150 mg/dL   HDL 50 >40 mg/dL   Total CHOL/HDL Ratio 4.7 RATIO   VLDL 46 (H) 0 - 40 mg/dL   LDL Cholesterol 141 (H) 0 - 99 mg/dL    Comment:        Total Cholesterol/HDL:CHD Risk Coronary Heart Disease Risk Table                     Men   Women  1/2 Average Risk   3.4   3.3  Average Risk       5.0   4.4  2 X Average Risk   9.6   7.1  3 X Average Risk  23.4   11.0        Use the calculated Patient Ratio above and the CHD Risk Table to determine the patient's CHD Risk.        ATP III CLASSIFICATION (LDL):  <100     mg/dL   Optimal  100-129  mg/dL   Near or Above                    Optimal  130-159  mg/dL   Borderline  160-189  mg/dL   High  >190     mg/dL   Very High Performed at [place] [address] [place], [place] [address]   Hemoglobin A1c     Status: None   Collection Time: 01/25/17  7:25 AM  Result Value Ref Range   Hgb A1c MFr Bld 5.3 4.8 - 5.6 %    Comment: (NOTE) Pre diabetes:          5.7%-6.4% Diabetes:              >6.4% Glycemic control for   <7.0% adults with diabetes    Mean Plasma Glucose 105.41 mg/dL    Comment: Performed at [facility] [address], [place], [place] [address]    Blood Alcohol level:  Lab Results  Component Value Date   ETH 23 (H) 01/21/2017   ETH <5 70/14/1030    Metabolic Disorder Labs: Lab Results  Component Value Date   HGBA1C 5.3 01/25/2017   MPG 105.41 01/25/2017   No results found for: PROLACTIN Lab Results  Component Value Date   CHOL 237 (H) 01/25/2017   TRIG 231 (H) 01/25/2017   HDL 50 01/25/2017   CHOLHDL 4.7 01/25/2017   VLDL 46 (H) 01/25/2017   LDLCALC 141 (H) 01/25/2017   LDLCALC 199 (H) 10/31/2014    Physical Findings: AIMS: Facial and Oral Movements Muscles of Facial  Expression: None, normal Lips and Perioral Area: None, normal Jaw: None, normal Tongue: None, normal,Extremity Movements Upper (arms, wrists, hands, fingers): None, normal Lower (legs, knees, ankles, toes): None, normal, Trunk Movements Neck, shoulders, hips: None, normal, Overall Severity Severity of abnormal movements (highest score from questions above): None, normal Incapacitation due to abnormal movements: None, normal Patient's awareness of abnormal movements (rate only patient's report): No Awareness, Dental Status Current problems with teeth and/or dentures?: Yes(broken tooth, left lower side ) Does patient usually wear dentures?: No  CIWA:  CIWA-Ar Total: 3 COWS:     Musculoskeletal: Strength & Muscle Tone: within normal limits Gait & Station: normal Patient leans: N/A  Psychiatric Specialty Exam: Physical Exam  Nursing note and vitals reviewed. Constitutional: She appears well-developed and well-nourished.  Cardiovascular: Normal rate.  Respiratory: Effort normal.  Musculoskeletal: Normal range of motion.  Neurological: She is alert.  Skin: Skin is warm.    Review of Systems  Constitutional: Negative.   HENT: Negative.   Eyes: Negative.   Respiratory: Negative.   Cardiovascular: Negative.   Gastrointestinal: Negative.   Genitourinary: Negative.   Musculoskeletal: Negative.   Skin: Negative.   Neurological: Negative.   Endo/Heme/Allergies: Negative.   Psychiatric/Behavioral: Positive for depression. The patient is nervous/anxious.     Blood pressure 126/85, pulse 80, temperature 97.9 F (36.6 C), temperature source Oral, resp. rate 18, height _0  (1.702 m), weight 79.6 kg (175 lb 8 oz).Body mass index is 27.49 kg/m.  General Appearance: Casual  Eye Contact:  Good  Speech:  Clear and Coherent and Normal Rate  Volume:  Normal  Mood:  Depressed  Affect:  Depressed and Flat  Thought Process:  Goal Directed and Descriptions of Associations: Intact   Orientation:  Full (Time, Place, and Person)  Thought Content:  WDL  Suicidal Thoughts:  No  Homicidal Thoughts:  No  Memory:  Immediate;   Good Recent;   Good Remote;   Good  Judgement:  Good  Insight:  Good  Psychomotor Activity:  Normal  Concentration:  Concentration: Good and Attention Span: Good  Recall:  Good  Fund of Knowledge:  Good  Language:  Good  Akathisia:  No  Handed:  Right  AIMS (if indicated):     Assets:  Communication Skills Desire for Improvement Financial Resources/Insurance Housing Physical Health Social Support Transportation  ADL's:  Intact  Cognition:  WNL  Sleep:  Number of Hours: 6.75   Problems Addressed: Bipolar Affective Alcohol abuse  Treatment Plan Summary: Daily contact with patient to assess and evaluate symptoms and progress in treatment, Medication management and Plan is to:  -Start Gabapentin 100 mg PO TID for agitation  -Continue Abilify 10 mg PO Daily for mood stability -Continue Vistaril 25 mg Q6H PRN for anxiety -Continue Ativan 1 mg Q6H PRN for CIWA >10 -Encourage group therapy participation  Lewis Shock, FNP 01/25/2017, 11:05 AM   Agree with NP Progress Note

## 2017-01-25 NOTE — Progress Notes (Signed)
Recreation Therapy Notes  Date: 01/25/17 Time: 0930 Location: [address] Dayroom  Group Topic: Stress Management  Goal Area(s) Addresses:  Patient will verbalize importance of using healthy stress management.  Patient will identify positive emotions associated with healthy stress management.   Behavioral Response: Engaged  Intervention: Stress Management  Activity : Guided Imagery.  LRT introduced the stress management technique of guided imagery.  Patients were to follow along as LRT read a script on letting go of unnecessary baggage in order to embrace a new beginning.  Education:  Stress Management, Discharge Planning.   Education Outcome: Acknowledges edcuation/In group clarification offered/Needs additional education  Clinical Observations/Feedback: Pt attended group.     Caroll RancherMarjette Ellamarie Naeve, LRT/CTRS         Lillia AbedLindsay, Kojo Liby A 01/25/2017 11:05 AM

## 2017-01-25 NOTE — [facility] Group Notes (Signed)
LCSW Group Therapy Note  01/25/2017 1:15pm  Type of Therapy and Topic:  Group Therapy: Avoiding Self-Sabotaging and Enabling Behaviors  Participation Level:  Minimal   Description of Group:   In this group, patients will learn how to identify obstacles, self-sabotaging and enabling behaviors, as well as: what are they, why do we do them and what needs these behaviors meet. Discuss unhealthy relationships and how to have positive healthy boundaries with those that sabotage and enable. Explore aspects of self-sabotage and enabling in yourself and how to limit these self-destructive behaviors in everyday life.   Therapeutic Goals: 1. Patient will identify one obstacle that relates to self-sabotage and enabling behaviors 2. Patient will identify one personal self-sabotaging or enabling behavior they did prior to admission 3. Patient will state a plan to change the above identified behavior 4. Patient will demonstrate ability to communicate their needs through discussion and/or role play.   Summary of Patient Progress:  [name] was attentive during group but did not actively participate in group discussion unless prompted by CSW. Pt stated that she is struggling with holding onto a negative relationship with her estranged husband in order to be close to her 14yo daughter. "However, I have to take care of my mental health, so I may move in with my mother out of town for a bit." [name] remained attentive throughout group and continues to demonstrate improving insight.    Therapeutic Modalities:   Cognitive Behavioral Therapy Person-Centered Therapy Motivational Interviewing   Pulte HomesHeather N Smart, LCSW 01/25/2017 11:07 AM

## 2017-01-25 NOTE — Progress Notes (Signed)
Patient did attend the evening speaker NA meeting.  

## 2017-01-25 NOTE — Progress Notes (Signed)
Nursing Progress Note 1900-0730  D) Patient presents with anxious mood and flat affect. Patient did attend group. Patient is minimally interactive with peers in the milieu but reports, "I had some good laughs today". Patient reports passive SI but denies HI/AVH or pain. Patient contracts for safety on the unit. Patient requests PRN medications for sleep and anxiety this evening. Patient medicated as prescribed.  A) Emotional support given. 1:1 interaction and active listening provided. Patient medicated as prescribed. Medications and plan of care reviewed with patient. Patient verbalized understanding without further questions. Snacks and fluids provided. Opportunities for questions or concerns presented to patient. Patient encouraged to continue to work on treatment goals. Labs, vital signs and patient behavior monitored throughout shift. Patient safety maintained with q15 min safety checks. Low fall risk precautions in place and reviewed with patient; patient verbalized understanding.  R) Patient receptive to interaction with nurse. Patient remains safe on the unit at this time. Patient denies any adverse medication reactions at this time. Patient is resting in bed without complaints. Will continue to monitor.

## 2017-01-25 NOTE — [facility] Suicide Risk Assessment (Signed)
[facility] INPATIENT:  Family/Significant Other Suicide Prevention Education  Suicide Prevention Education:  Education Completed; Marta LamasDebbie Powell (pt's mother) [phone] has been identified by the patient as the family member/significant other with whom the patient will be residing, and identified as the person(s) who will aid the patient in the event of a mental health crisis (suicidal ideations/suicide attempt).  With written consent from the patient, the family member/significant other has been provided the following suicide prevention education, prior to the and/or following the discharge of the patient.  The suicide prevention education provided includes the following:  Suicide risk factors  Suicide prevention and interventions  National Suicide Hotline telephone number  [facility] assessment telephone number  [facility] Assistance 911  [facility] and/or Residential Mobile Crisis Unit telephone number  Request made of family/significant other to:  Remove weapons (e.g., guns, rifles, knives), all items previously/currently identified as safety concern.    Remove drugs/medications (over-the-counter, prescriptions, illicit drugs), all items previously/currently identified as a safety concern.  The family member/significant other verbalizes understanding of the suicide prevention education information provided.  The family member/significant other agrees to remove the items of safety concern listed above.  Pt's mother has no concerns regarding pt's safety at discharge. "She can live here when she lives." Pt does not have access to guns/weapons. "She has an addictive personality and I'm worried about her not being able to give up alcohol."   Pulte HomesHeather N Smart LCSW 01/25/2017, 11:17 AM

## 2017-01-26 LAB — HEPATITIS C ANTIBODY: HCV Ab: <0.1 s/co ratio (ref 0.0–0.9)

## 2017-01-26 MED ORDER — ARIPIPRAZOLE 15 MG PO TABS
15.0000 mg | ORAL_TABLET | Freq: Every day | ORAL | Status: DC
  Administered 2017-01-27: 15 mg via ORAL
  Filled 2017-01-26: qty 1
  Filled 2017-01-26: qty 7
  Filled 2017-01-26: qty 1

## 2017-01-26 MED ORDER — GABAPENTIN 300 MG PO CAPS
300.0000 mg | ORAL_CAPSULE | Freq: Three times a day (TID) | ORAL | Status: DC
  Administered 2017-01-26 – 2017-01-27 (×3): 300 mg via ORAL
  Filled 2017-01-26 (×4): qty 1
  Filled 2017-01-26: qty 21
  Filled 2017-01-26: qty 1
  Filled 2017-01-26 (×2): qty 21

## 2017-01-26 NOTE — [facility] Group Notes (Signed)
LCSW Group Therapy Note  01/26/2017 1:15pm  Type of Therapy/Topic:  Group Therapy:  Feelings about Diagnosis  Participation Level:  Active   Description of Group:   This group will allow patients to explore their thoughts and feelings about diagnoses they have received. Patients will be guided to explore their level of understanding and acceptance of these diagnoses. Facilitator will encourage patients to process their thoughts and feelings about the reactions of others to their diagnosis and will guide patients in identifying ways to discuss their diagnosis with significant others in their lives. This group will be process-oriented, with patients participating in exploration of their own experiences, giving and receiving support, and processing challenge from other group members.   Therapeutic Goals: 1. Patient will demonstrate understanding of diagnosis as evidenced by identifying two or more symptoms of the disorder 2. Patient will be able to express two feelings regarding the diagnosis 3. Patient will demonstrate their ability to communicate their needs through discussion and/or role play  Summary of Patient Progress:  [name] was attentive and engaged during today's processing group. She shared that she feels relieved to know her diagnosis and agrees with it. "I just don't know how I'm going to manage my symptoms when I get out of here." [name] shared that her family is "mostly unsupportive" but plans to move in with her mother at discharge. "She can help me get to my appts and make sure I take my medication." [name] continues to show limited progress in the group setting with limited insight.   Therapeutic Modalities:   Cognitive Behavioral Therapy Brief Therapy Feelings Identification    Ledell PeoplesHeather N Smart, LCSW 01/26/2017 10:26 AM

## 2017-01-26 NOTE — Progress Notes (Signed)
Goals group  Attended and participated   Patient set a goal to make decisions about living environment.

## 2017-01-26 NOTE — Progress Notes (Signed)
Patient denies SI, HI and AVH.  Patient reports feeling depressed and undecided about her discharge planning. Patient has been active in groups and unit activities.   Assess patient for safety, offer medications as prescribed, engage in 1:1 staff talks.   Continue to monitor as planned.  Patient able to contract for safety.

## 2017-01-26 NOTE — Progress Notes (Signed)
[facility] MD Progress Note  01/26/2017 8:18 AM Tanvi Gatling  MRN:  [id number]   Subjective: patient reports partially improved mood. States she is feeling better than before admission but still feels depressed . Today denies Suicidal plans or intentions, but states she still feels like she would like to " disappear " at times .  Reports she had a good visit from family yesterday. At this time presents more future oriented and focusing more on where she is going to go after discharge. States she does not plan to return home to husband at present, and thinks she is going to spend some time living with mother. No current alcohol WDL symptoms reported . Denies cravings for alcohol.    Objective: Case discussed with treatment team and patient seen . Staff reports that patient remains depressed, anxious, but reporting some improvement compared to how she felt prior to admission. She has endorsed passive SI but is contracting  for safety on unit At this time presents with depressed mood, but affect is more reactive, and she seems less anxious and less dysphoric . As above, currently more future oriented, and focusing more on disposition plans. Denies alcohol withdrawal- no tremors, no diaphoresis, no acute distress or discomfort, VSS. No disruptive or agitated behaviors on unit, visible on unit, limited group participation.    Principal Problem: Bipolar affective disorder [facility]) Diagnosis:   Patient Active Problem List   Diagnosis Date Noted  . MDD (major depressive disorder) [F32.9] 01/24/2017  . Severe episode of recurrent major depressive disorder, without psychotic features ([place]) [F33.2]   . Severe recurrent major depression without psychotic features ([place]) [F33.2] 10/30/2014  . Alcohol abuse [F10.10] 10/30/2014  . Cluster B personality disorder ([place]) [F60.9] 09/06/2014  . Bipolar affective disorder ([place]) [F31.9] 09/06/2011   Total Time spent with patient: 20 minutes  Past Psychiatric History:  See H&P  Past Medical History:  Past Medical History:  Diagnosis Date  . Anxiety    per pt report   . Bipolar disorder ([place])    per pt report  . Depression   . Mental disorder     Past Surgical History:  Procedure Laterality Date  . ABDOMINAL HYSTERECTOMY    . CHOLECYSTECTOMY     Family History: History reviewed. No pertinent family history. Family Psychiatric  History: See H&P Social History:  Social History   Substance and Sexual Activity  Alcohol Use Yes   Comment: 48 oz beer, 1 bottle of wine, close to a 1/5th of vodka      Social History   Substance and Sexual Activity  Drug Use Yes  . Types: Marijuana   Comment: THC- occ use     Social History   Socioeconomic History  . Marital status: Married    Spouse name: None  . Number of children: None  . Years of education: None  . Highest education level: None  Social Needs  . Financial resource strain: None  . Food insecurity - worry: None  . Food insecurity - inability: None  . Transportation needs - medical: None  . Transportation needs - non-medical: None  Occupational History  . None  Tobacco Use  . Smoking status: Current Every Day Smoker    Packs/day: 1.00    Years: 16.00    Pack years: 16.00  . Smokeless tobacco: Never Used  . Tobacco comment: given info about patches and 2 wk free supply after discharge  Substance and Sexual Activity  . Alcohol use: Yes    Comment:  48 oz beer, 1 bottle of wine, close to a 1/5th of vodka   . Drug use: Yes    Types: Marijuana    Comment: THC- occ use   . Sexual activity: Yes    Birth control/protection: Surgical  Other Topics Concern  . None  Social History Narrative  . None   Additional Social History:   Sleep: Fair  Appetite:  Good  Current Medications: Current Facility-Administered Medications  Medication Dose Route Frequency Provider Last Rate Last Dose  . acetaminophen (TYLENOL) tablet 650 mg  650 mg Oral Q6H PRN Derrill Center, NP      . alum &  mag hydroxide-simeth (MAALOX/MYLANTA) 200-200-20 MG/5ML suspension 30 mL  30 mL Oral Q4H PRN Derrill Center, NP      . ARIPiprazole (ABILIFY) tablet 10 mg  10 mg Oral Daily Diera Wirkkala, Myer Peer, MD   10 mg at 01/25/17 0746  . gabapentin (NEURONTIN) capsule 100 mg  100 mg Oral TID Money, Darnelle Maffucci B, FNP   100 mg at 01/25/17 1714  . hydrOXYzine (ATARAX/VISTARIL) tablet 25 mg  25 mg Oral Q6H PRN Grant Henkes, Myer Peer, MD   25 mg at 01/25/17 2127  . loperamide (IMODIUM) capsule 2-4 mg  2-4 mg Oral PRN Jadalynn Burr, Myer Peer, MD      . LORazepam (ATIVAN) tablet 1 mg  1 mg Oral Q6H PRN Kalli Greenfield, Myer Peer, MD   1 mg at 01/25/17 2127  . magnesium hydroxide (MILK OF MAGNESIA) suspension 30 mL  30 mL Oral Daily PRN Derrill Center, NP      . multivitamin with minerals tablet 1 tablet  1 tablet Oral Daily Emil Weigold, Myer Peer, MD   1 tablet at 01/25/17 0746  . nicotine (NICODERM CQ - dosed in mg/24 hours) patch 21 mg  21 mg Transdermal Daily Shevonne Wolf, Myer Peer, MD   21 mg at 01/25/17 0748  . thiamine (VITAMIN B-1) tablet 100 mg  100 mg Oral Daily Glover Capano, Myer Peer, MD   100 mg at 01/25/17 0746    Lab Results:  Results for orders placed or performed during the hospital encounter of 01/24/17 (from the past 48 hour(s))  Basic metabolic panel     Status: Abnormal   Collection Time: 01/25/17  7:25 AM  Result Value Ref Range   Sodium 134 (L) 135 - 145 mmol/L   Potassium 3.9 3.5 - 5.1 mmol/L   Chloride 98 (L) 101 - 111 mmol/L   CO2 27 22 - 32 mmol/L   Glucose, Bld 123 (H) 65 - 99 mg/dL   BUN 12 6 - 20 mg/dL   Creatinine, Ser 0.75 0.44 - 1.00 mg/dL   Calcium 9.1 8.9 - 10.3 mg/dL   GFR calc non Af Amer >60 >60 mL/min   GFR calc Af Amer >60 >60 mL/min    Comment: (NOTE) The eGFR has been calculated using the CKD EPI equation. This calculation has not been validated in all clinical situations. eGFR's persistently <60 mL/min signify possible Chronic Kidney Disease.    Anion gap 9 5 - 15    Comment: Performed at [facility], [place] [address], [place], [place] [address]  HIV antibody     Status: None   Collection Time: 01/25/17  7:25 AM  Result Value Ref Range   HIV Screen 4th Generation wRfx Non Reactive Non Reactive    Comment: (NOTE) Performed At: [facility] [address] [place], [place] [id number] Rush Farmer MD [id number] Performed at [facility]  [place] [address] [place], [place] [address]   RPR     Status: None   Collection Time: 01/25/17  7:25 AM  Result Value Ref Range   RPR Ser Ql Non Reactive Non Reactive    Comment: (NOTE) Performed At: [facility] [place], [place] [id number] Rush Farmer MD [id number] Performed at [facility], [place] [address] [place], [place] [address]   TSH     Status: None   Collection Time: 01/25/17  7:25 AM  Result Value Ref Range   TSH 2.549 0.350 - 4.500 uIU/mL    Comment: Performed by a 3rd Generation assay with a functional sensitivity of <=0.01 uIU/mL. Performed at [facility], [place] [address] [place], [place] [address]   Lipid panel     Status: Abnormal   Collection Time: 01/25/17  7:25 AM  Result Value Ref Range   Cholesterol 237 (H) 0 - 200 mg/dL   Triglycerides 231 (H) <150 mg/dL   HDL 50 >40 mg/dL   Total CHOL/HDL Ratio 4.7 RATIO   VLDL 46 (H) 0 - 40 mg/dL   LDL Cholesterol 141 (H) 0 - 99 mg/dL    Comment:        Total Cholesterol/HDL:CHD Risk Coronary Heart Disease Risk Table                     Men   Women  1/2 Average Risk   3.4   3.3  Average Risk       5.0   4.4  2 X Average Risk   9.6   7.1  3 X Average Risk  23.4   11.0        Use the calculated Patient Ratio above and the CHD Risk Table to determine the patient's CHD Risk.        ATP III CLASSIFICATION (LDL):  <100     mg/dL   Optimal  100-129  mg/dL   Near or Above                    Optimal  130-159  mg/dL   Borderline  160-189  mg/dL   High  >190      mg/dL   Very High Performed at [place] [address], [place], [place] [address]   Hemoglobin A1c     Status: None   Collection Time: 01/25/17  7:25 AM  Result Value Ref Range   Hgb A1c MFr Bld 5.3 4.8 - 5.6 %    Comment: (NOTE) Pre diabetes:          5.7%-6.4% Diabetes:              >6.4% Glycemic control for   <7.0% adults with diabetes    Mean Plasma Glucose 105.41 mg/dL    Comment: Performed at [place] [address] [place], [place] [address]    Blood Alcohol level:  Lab Results  Component Value Date   ETH 23 (H) 01/21/2017   ETH <5 46/65/9935    Metabolic Disorder Labs: Lab Results  Component Value Date   HGBA1C 5.3 01/25/2017   MPG 105.41 01/25/2017   No results found for: PROLACTIN Lab Results  Component Value Date   CHOL 237 (H) 01/25/2017   TRIG 231 (H) 01/25/2017   HDL 50 01/25/2017   CHOLHDL 4.7 01/25/2017   VLDL 46 (H) 01/25/2017   LDLCALC 141 (H) 01/25/2017  LDLCALC 199 (H) 10/31/2014    Physical Findings: AIMS: Facial and Oral Movements Muscles of Facial Expression: None, normal Lips and Perioral Area: None, normal Jaw: None, normal Tongue: None, normal,Extremity Movements Upper (arms, wrists, hands, fingers): None, normal Lower (legs, knees, ankles, toes): None, normal, Trunk Movements Neck, shoulders, hips: None, normal, Overall Severity Severity of abnormal movements (highest score from questions above): None, normal Incapacitation due to abnormal movements: None, normal Patient's awareness of abnormal movements (rate only patient's report): No Awareness, Dental Status Current problems with teeth and/or dentures?: Yes(broken tooth to L lower side) Does patient usually wear dentures?: No  CIWA:  CIWA-Ar Total: 1 COWS:     Musculoskeletal: Strength & Muscle Tone: within normal limits Gait & Station: normal Patient leans: N/A  Psychiatric Specialty Exam: Physical Exam  Nursing note and vitals  reviewed. Constitutional: She appears well-developed and well-nourished.  Cardiovascular: Normal rate.  Respiratory: Effort normal.  Musculoskeletal: Normal range of motion.  Neurological: She is alert.  Skin: Skin is warm.    Review of Systems  Constitutional: Negative.   HENT: Negative.   Eyes: Negative.   Respiratory: Negative.   Cardiovascular: Negative.   Gastrointestinal: Negative.   Genitourinary: Negative.   Musculoskeletal: Negative.   Skin: Negative.   Neurological: Negative.   Endo/Heme/Allergies: Negative.   Psychiatric/Behavioral: Positive for depression. The patient is nervous/anxious.   no chest pain , no shortness of breath, no vomiting   Blood pressure 118/73, pulse 96, temperature 98.9 F (37.2 C), temperature source Oral, resp. rate 16, height _0  (1.702 m), weight 79.6 kg (175 lb 8 oz).Body mass index is 27.49 kg/m.  General Appearance: improving grooming   Eye Contact:  Good  Speech:  Normal Rate  Volume:  Normal  Mood:  remains depressed, but states she is feeling better than prior to admission   Affect:  Constricted and does smile briefly at times   Thought Process:  Linear and Descriptions of Associations: Intact  Orientation:  Other:  fully alert and attentive   Thought Content:  no hallucinations, no delusions, not internally preoccupied   Suicidal Thoughts:  No-denies active  suicidal or self injurious ideations, endorses intermittent passive SI, but contracts for safety on unit,  denies homicidal ideations  Homicidal Thoughts:  No  Memory:  recent and remote grossly intact   Judgement:  Fair- improving   Insight:  Fair- improving   Psychomotor Activity:  Normal- no tremors, no diaphoresis, no psychomotor agitation  Concentration:  Concentration: Good and Attention Span: Good  Recall:  Good  Fund of Knowledge:  Good  Language:  Good  Akathisia:  No  Handed:  Right  AIMS (if indicated):     Assets:  Communication Skills Desire for  Improvement Financial Resources/Insurance Housing Physical Health Social Support Transportation  ADL's:  Intact  Cognition:  WNL  Sleep:  Number of Hours: 5.5    Assessment - Patient reports ongoing depression, some passive SI, but endorses partial improvement compared to admission, and states she is feeling better overall . Noted to be more future oriented, and states she is thinking of moving in with mother after discharge. No alcohol WDL symptoms at this time. Of note, we discussed pharmacological options such as Acamprosate to help address alcohol abuse, but patient not currently interested in adding medications. She is tolerating medications well . On Abilify for mood disorder .    Treatment Plan Summary: Treatment Plan reviewed as below today 1/24 Daily contact with patient to assess and evaluate  symptoms and progress in treatment, Medication management and Plan is to:  -Increase  Gabapentin to 341m PO TID for anxiety, alcohol use disorder -Increase Abilify to 15  mg PO QDAY for mood disorder  -Continue Vistaril 25 mg Q6H PRN for anxiety -Continue Ativan PRNs if needed for alcohol WDL  -Encourage group therapy participation to work on coping skills and symptom reduction -Continue to encourage efforts to work on recovery and relapse prevention -Treatment team working on disposition planning options.  FJenne Campus MD 01/26/2017, 8:18 AM   Patient ID: MLevonne Lapping female   DOB: 504-Jun-1977 42y.o.   MRN: [id number]

## 2017-01-26 NOTE — Progress Notes (Signed)
Adult Psychoeducational Group Note  Date:  01/26/2017 Time:  6:26 PM  Group Topic/Focus:  Making Healthy Choices:   The focus of this group is to help patients identify negative/unhealthy choices they were using prior to admission and identify positive/healthier coping strategies to replace them upon discharge.  Participation Level:  Active  Participation Quality:  Appropriate  Affect:  Appropriate  Cognitive:  Alert and Appropriate  Insight: Appropriate and Good  Engagement in Group:  Engaged  Modes of Intervention:  Discussion  Additional Comments:  Pt did participate in group discussions and states that she wants to become more independent and live on her own.    Ein Rijo R Elyse Prevo 01/26/2017, 6:26 PM

## 2017-01-26 NOTE — Progress Notes (Signed)
Nursing Progress Note 1900-0730  D) Patient presents with pleasant mood and mild anxiety. Patient did attend group. Patient is seen interactive in the milieu. Patient states she is going home tomorrow and "knows I need to go to a healthy, safe environment this time". Patient denies SI/HI/AVH or pain. Patient contracts for safety on the unit. Patient reports sleeping well with current regimen. Patient requests PRN medications for sleep and anxiety this evening.  A) Emotional support given. 1:1 interaction and active listening provided. Patient medicated as prescribed. Medications and plan of care reviewed with patient. Patient verbalized understanding without further questions. Snacks and fluids provided. Opportunities for questions or concerns presented to patient. Patient encouraged to continue to work on treatment goals. Labs, vital signs and patient behavior monitored throughout shift. Patient safety maintained with q15 min safety checks. Low fall risk precautions in place and reviewed with patient; patient verbalized understanding.  R) Patient receptive to interaction with nurse. Patient remains safe on the unit at this time. Patient denies any adverse medication reactions at this time. Patient is resting in bed without complaints. Will continue to monitor.

## 2017-01-27 MED ORDER — GABAPENTIN 300 MG PO CAPS: 300.0000 mg | ORAL_CAPSULE | Freq: Three times a day (TID) | ORAL | 0 refills | Status: DC

## 2017-01-27 MED ORDER — HYDROXYZINE HCL 25 MG PO TABS: 25.0000 mg | ORAL_TABLET | Freq: Four times a day (QID) | ORAL | 0 refills | Status: DC | PRN

## 2017-01-27 MED ORDER — ARIPIPRAZOLE 15 MG PO TABS: 15.0000 mg | ORAL_TABLET | Freq: Every day | ORAL | 0 refills | Status: DC

## 2017-01-27 NOTE — [facility] Suicide Risk Assessment (Signed)
[facility]   Principal Problem: Bipolar affective disorder [facility](HCC) Discharge Diagnoses:  Patient Active Problem List   Diagnosis Date Noted  . MDD (major depressive disorder) [F32.9] 01/24/2017  . Severe episode of recurrent major depressive disorder, without psychotic features (HCC) [F33.2]   . Severe recurrent major depression without psychotic features (HCC) [F33.2] 10/30/2014  . Alcohol abuse [F10.10] 10/30/2014  . 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 Bipolar affective disorder (HCC) [F31.9] 09/06/2011    Total Time spent with patient: 30 minutes  Musculoskeletal: Strength & Muscle Tone: within normal limits Gait & Station: normal Patient leans: N/A  Psychiatric Specialty Exam: ROS  No headache, no chest pain, no shortness of breath, no vomiting , no rash, no fever  Blood pressure 107/82, pulse (!) 108, temperature 97.9 F (36.6 C), temperature source Oral, resp. rate 16, height 5\' 7"  (1.702 m), weight 79.6 kg (175 lb 8 oz).Body mass index is 27.49 kg/m.  General Appearance: Well Groomed  Eye Contact::  Good  Speech:  Normal Rate409  Volume:  Normal  Mood:  reports her mood is improved, states she is feeling better - states she feels mood is more stable  Affect:  fuller in range, mildly anxious   Thought Process:  Linear and Descriptions of Associations: Intact  Orientation:  Other:  fully alert and attentive   Thought Content:  no hallucinations, no delusions   Suicidal Thoughts:  No- denies any suicidal or self injurious ideations   Homicidal Thoughts:  No  Memory:  recent and remote grossly intact   Judgement:  Other:  improving   Insight:  fair- improving   Psychomotor Activity:  Normal  Concentration:  Good  Recall:  Good  Fund of Knowledge:Good  Language: Good  Akathisia:  Negative  Handed:  Right  AIMS (if indicated):   no abnormal or involuntary movements noted or reported   Assets:  Communication  Skills Desire for Improvement Resilience  Sleep:  Number of Hours: 6.25  Cognition: WNL  ADL's:  Intact   Mental Status Per Nursing Assessment::   On Admission:  Suicidal ideation indicated by patient  Demographic Factors:  42 year old married female, has two surviving children.  Loss Factors: Alcohol use disorder, marital strain  , death of son ( from cancer) in 2016.   Historical Factors: History of several psychiatric admissions, history of suicidal attempts in the past, history of self cutting, has been diagnosed with Bipolar Disorder in the past .   Risk Reduction Factors:   Responsible for children under 44 years of age, Employed, Living with another person, especially a relative and Positive coping skills or problem solving skills  Continued Clinical Symptoms:  Patient reports she is feeling better, and is less depressed . She denies suicidal or self injurious ideations, denies homicidal ideations, no hallucinations, no delusions, not internally preoccupied, future oriented. States she plans to go live with her mother for a period of time as mother is very supportive and environment at mother's is less stressful.  . States she is wanting to develop a hobby to help her deal with stress and is thinking of buying old furniture pieces and refurbishing them.  Denies medication side effects. Behavior on unit in good control, cooperative on approach.  No symptoms of alcohol WDL .    Cognitive Features That Contribute To Risk:  No gross cognitive deficits noted upon discharge. Is alert , attentive, and oriented x 3   Suicide Risk:  Mild:  Suicidal ideation of limited frequency, intensity, duration, and specificity.  There are no identifiable plans, no associated intent, mild dysphoria and related symptoms, good self-control (both objective and subjective assessment), few other risk factors, and identifiable protective factors, including available and accessible social  support.  Follow-up Information    [facility] Follow up.   Why:  Please walk in within 3 days of hospital discharge to be assessed for outpatient mental health services including: medication management and therapy. Walk in hours: Mon-Fri 8am-3pm. Please arrive as early as possible to ensure that you are seen. Contact information: [address] [place], [place] [address] Phone: [phone] Fax: [phone]          Plan Of Care/Follow-up recommendations:  Activity:  as tolerated  Diet:  regular Tests:  NA Other:  See below  Patient is expressing readiness for discharge  She is planning to go live with her mother for a period of time, mother is picking her up later today Follow up at [facility]. Encouraged to focus also on abstinence from alcohol as an integral part of treatment plan and encouraged to go to [employer]   Craige Cotta, MD 01/27/2017, 9:50 AM

## 2017-01-27 NOTE — Discharge Summary (Signed)
Physician Discharge Summary Note  Patient:  [name] is an 42 y.o., female MRN:  [id number] DOB:  Apr 22, 1975 Patient phone:  [phone] (home)  Patient address:   [address] [place] [id number],  Total Time spent with patient: 20 minutes  Date of Admission:  01/24/2017 Date of Discharge: 01/27/17  Reason for Admission:  Worsening depression with SI  Principal Problem: Bipolar affective disorder [facility](HCC) Discharge Diagnoses: Patient Active Problem List   Diagnosis Date Noted  . MDD (major depressive disorder) [F32.9] 01/24/2017  . Severe episode of recurrent major depressive disorder, without psychotic features (HCC) [F33.2]   . Severe recurrent major depression without psychotic features (HCC) [F33.2] 10/30/2014  . Alcohol abuse [F10.10] 10/30/2014  . 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 Bipolar affective disorder (HCC) [F31.9] 09/06/2011    Past Psychiatric History: Reports history of several prior psychiatric admissions starting a few years ago, most recently two years ago. One prior suicidal attempt two years ago by overdosing . History of self cutting. States she stopped about one month ago. Denies history of psychosis. Has been diagnosed with Bipolar Disorder and with MDD in the past. She does report significant mood swings and mood instability. Reports history of Bulimic behaviors- see above, x several months, and in the past when in college.   Past Medical History:  Past Medical History:  Diagnosis Date  . Anxiety    per pt report   . Bipolar disorder (HCC)    per pt report  . Depression   . Mental disorder     Past Surgical History:  Procedure Laterality Date  . ABDOMINAL HYSTERECTOMY    . CHOLECYSTECTOMY     Family History: History reviewed. No pertinent family history. Family Psychiatric  History: Denies Social History:  Social History   Substance and Sexual Activity  Alcohol Use Yes   Comment: 48 oz beer, 1  bottle of wine, close to a 1/5th of vodka      Social History   Substance and Sexual Activity  Drug Use Yes  . Types: Marijuana   Comment: THC- occ use     Social History   Socioeconomic History  . Marital status: Married    Spouse name: None  . Number of children: None  . Years of education: None  . Highest education level: None  Social Needs  . Financial resource strain: None  . Food insecurity - worry: None  . Food insecurity - inability: None  . Transportation needs - medical: None  . Transportation needs - non-medical: None  Occupational History  . None  Tobacco Use  . Smoking status: Current Every Day Smoker    Packs/day: 1.00    Years: 16.00    Pack years: 16.00  . Smokeless tobacco: Never Used  . Tobacco comment: given info about patches and 2 wk free supply after discharge  Substance and Sexual Activity  . Alcohol use: Yes    Comment: 48 oz beer, 1 bottle of wine, close to a 1/5th of vodka   . Drug use: Yes    Types: Marijuana    Comment: THC- occ use   . Sexual activity: Yes    Birth control/protection: Surgical  Other Topics Concern  . None  Social History Narrative  . None    [facility]ospital Course:   [facility]: 42 year old married female. Presented to ED on 1/19 for depression, alcohol abuse, suicidal ideations of overdosing .  Reports she felt  like " I hit rock bottom" and asked her husband to bring her to ED. Describes alcohol use disorder, and reports she  has been drinking daily , generally 3-4 high alcohol content beers, sometimes a bottle of wine per day.  Admission BAL 23, admission UDS positive for cannabis. She also reports history of impulsive behaviors including overspending, impulsive sexual activity often in the context of alcohol intoxications , breaking items at home such as plates, dishes, when feeling upset . These issues  have caused significant sense of self shame and  marital strain . She also endorses eating disorder  symptoms, mainly bulimia, with episodes of binge eating and self induced vomiting  Patient remained on the [facility] unit for 3 days and stabilized with medication and therapy. Patient was Continue on Abilify and titrated to 15 mg Daily and used Vistaril and Trazodone PRN during stay. Antidepressants were discontinued with concern of worsening manic symptoms. Patient showed improvement with improved mood, affect, appetite, sleep, and interaction. Patient was seen in the day room interacting with peers and staff appropriately. Patient attended groups and participated.Patient denies any SI/HI/AVH and contracts for safety. Patient agrees to follow up at [facility]. Patient is provided with prescriptions for her medications upon discharge.     Physical Findings: AIMS: Facial and Oral Movements Muscles of Facial Expression: None, normal Lips and Perioral Area: None, normal Jaw: None, normal Tongue: None, normal,Extremity Movements Upper (arms, wrists, hands, fingers): None, normal Lower (legs, knees, ankles, toes): None, normal, Trunk Movements Neck, shoulders, hips: None, normal, Overall Severity Severity of abnormal movements (highest score from questions above): None, normal Incapacitation due to abnormal movements: None, normal Patient's awareness of abnormal movements (rate only patient's report): No Awareness, Dental Status Current problems with teeth and/or dentures?: Yes(broken tooth to L lower side) Does patient usually wear dentures?: No  CIWA:  CIWA-Ar Total: 0 COWS:     Musculoskeletal: Strength & Muscle Tone: within normal limits Gait & Station: normal Patient leans: N/A  Psychiatric Specialty Exam: Physical Exam  Nursing note and vitals reviewed. Constitutional: She is oriented to person, place, and time. She appears well-developed.  Respiratory: Effort normal.  Musculoskeletal: Normal range of motion.  Neurological: She is alert and oriented to person, place, and time.  Skin: Skin  is warm.    Review of Systems  Constitutional: Negative.   HENT: Negative.   Eyes: Negative.   Respiratory: Negative.   Cardiovascular: Negative.   Gastrointestinal: Negative.   Genitourinary: Negative.   Musculoskeletal: Negative.   Skin: Negative.   Neurological: Negative.   Endo/Heme/Allergies: Negative.     Blood pressure 107/82, pulse (!) 108, temperature 97.9 F (36.6 C), temperature source Oral, resp. rate 16, height 5\' 7"  (1.702 m), weight 79.6 kg (175 lb 8 oz).Body mass index is 27.49 kg/m.  General Appearance: Casual  Eye Contact:  Good  Speech:  Clear and Coherent and Normal Rate  Volume:  Normal  Mood:  Euthymic  Affect:  Congruent  Thought Process:  Goal Directed and Descriptions of Associations: Intact  Orientation:  Full (Time, Place, and Person)  Thought Content:  WDL  Suicidal Thoughts:  No  Homicidal Thoughts:  No  Memory:  Immediate;   Good Recent;   Good Remote;   Good  Judgement:  Good  Insight:  Good  Psychomotor Activity:  Normal  Concentration:  Concentration: Good and Attention Span: Good  Recall:  Good  Fund of Knowledge:  Good  Language:  Good  Akathisia:  No  Handed:  Right  AIMS (if indicated):     Assets:  Communication Skills Desire for Improvement Financial Resources/Insurance Housing Physical Health Social Support Transportation  ADL's:  Intact  Cognition:  WNL  Sleep:  Number of Hours: 6.25     Have you used any form of tobacco in the last 30 days? (Cigarettes, Smokeless Tobacco, Cigars, and/or Pipes): Yes  Has this patient used any form of tobacco in the last 30 days? (Cigarettes, Smokeless Tobacco, Cigars, and/or Pipes) Yes, Yes, A prescription for an FDA-approved tobacco cessation medication was offered at discharge and the patient refused  Blood Alcohol level:  Lab Results  Component Value Date   ETH 23 (H) 01/21/2017   ETH <5 10/29/2014    Metabolic Disorder Labs:  Lab Results  Component Value Date   HGBA1C 5.3  01/25/2017   MPG 105.41 01/25/2017   No results found for: PROLACTIN Lab Results  Component Value Date   CHOL 237 (H) 01/25/2017   TRIG 231 (H) 01/25/2017   HDL 50 01/25/2017   CHOLHDL 4.7 01/25/2017   VLDL 46 (H) 01/25/2017   LDLCALC 141 (H) 01/25/2017   LDLCALC 199 (H) 10/31/2014    See Psychiatric Specialty Exam and Suicide Risk Assessment completed by Attending Physician prior to discharge.  Discharge destination:  Home  Is patient on multiple antipsychotic therapies at discharge:  No   Has Patient had three or more failed trials of antipsychotic monotherapy by history:  No  Recommended Plan for Multiple Antipsychotic Therapies: NA   Allergies as of 01/27/2017   No Known Allergies     Medication List    STOP taking these medications   amoxicillin 500 MG tablet Commonly known as:  AMOXIL   buPROPion 150 MG 12 hr tablet Commonly known as:  WELLBUTRIN SR   FLUoxetine 10 MG tablet Commonly known as:  PROZAC   traZODone 50 MG tablet Commonly known as:  DESYREL   venlafaxine XR 75 MG 24 hr capsule Commonly known as:  EFFEXOR-XR     TAKE these medications     Indication  ARIPiprazole 15 MG tablet Commonly known as:  ABILIFY Take 1 tablet (15 mg total) by mouth daily. For mood stability Start taking on:  01/28/2017 What changed:    medication strength  how much to take  additional instructions  Indication:  Schizophrenia, mood stability   gabapentin 300 MG capsule Commonly known as:  NEURONTIN Take 1 capsule (300 mg total) by mouth 3 (three) times daily. What changed:  when to take this  Indication:  Agitation   hydrOXYzine 25 MG tablet Commonly known as:  ATARAX/VISTARIL Take 1 tablet (25 mg total) by mouth every 6 (six) hours as needed (anxiety/agitation).  Indication:  Feeling Anxious      Follow-up Information    [facility] Follow up.   Why:  Please walk in within 3 days of hospital discharge to be assessed for outpatient mental health  services including: medication management and therapy. Walk in hours: Mon-Fri 8am-3pm. Please arrive as early as possible to ensure that you are seen. Contact information: [address] [place], [place] [address] Phone: [phone] Fax: [phone]          Follow-up recommendations:  Continue activity as tolerated. Continue diet as recommended by your PCP. Ensure to keep all appointments with outpatient providers.  Comments:  Patient is instructed prior to discharge to: Take all medications as prescribed by his/her mental healthcare provider. Report any adverse effects and or reactions from the medicines to  his/her outpatient provider promptly. Patient has been instructed & cautioned: To not engage in alcohol and or illegal drug use while on prescription medicines. In the event of worsening symptoms, patient is instructed to call the crisis hotline, 911 and or go to the nearest ED for appropriate evaluation and treatment of symptoms. To follow-up with his/her primary care provider for your other medical issues, concerns and or health care needs.    Signed: Gerlene Burdock Money, FNP 01/27/2017, 8:45 AM   Patient seen, Suicide Assessment Completed.  Disposition Plan Reviewed

## 2017-01-27 NOTE — Progress Notes (Signed)
  [facility] :  Will you be returning to the same living situation after discharge:  No.Pt [place] at [place].  At discharge, do you have transportation home?: Yes,  pt's mother will pick her up after lunch Do you have the ability to pay for your medications: Yes,  mental health  Release of information consent forms completed and submitted to medical records by CSW.  Patient to Follow up at: Follow-up Information    [facility] Follow up.   Why:  Please walk in within 3 days of hospital discharge to be assessed for outpatient mental health services including: medication management and therapy. Walk in hours: Mon-Fri 8am-3pm. Please arrive as early as possible to ensure that you are seen. Contact information: [address] [place], [place] [address] Phone: [phone] Fax: [phone]          Next level of care provider has access to [facility] Link:no  Safety Planning and Suicide Prevention discussed: Yes,  SPE completed with pt's mother. SPI pamphlet and Mobile Crisis information provided to pt.   Have you used any form of tobacco in the last 30 days? (Cigarettes, Smokeless Tobacco, Cigars, and/or Pipes): Yes  Has patient been referred to the Quitline?: Patient refused referral  Patient has been referred for addiction treatment: Yes  Pulte HomesHeather N Smart, LCSW 01/27/2017, 8:47 AM
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
## 2017-01-27 NOTE — Progress Notes (Signed)
Pt d/c from the hospital. All items returned. D/C instructions given, prescriptions given and samples given. Pt denies si and hi. 

## 2017-01-27 NOTE — Progress Notes (Signed)
Recreation Therapy Notes  Date: 01/27/17 Time: 0930 Location: 300 Hall Dayroom  Group Topic: Stress Management  Goal Area(s) Addresses:  Patient will verbalize importance of using healthy stress management.  Patient will identify positive emotions associated with healthy stress management.   Intervention: Stress Management  Activity : Progressive Muscle Relaxation.  LRT introduced the stress management technique of progressive muscle relaxation.  LRT led patients through the technique which allowed them to tense and relax each muscle group individually.  Education:  Stress Management, Discharge Planning.   Education Outcome: Acknowledges edcuation/In group clarification offered/Needs additional education  Clinical Observations/Feedback: Pt did not attend group.     Castin Donaghue, LRT/CTRS         [place] Blizzard A 01/27/2017 11:04 AM 

## 2018-04-25 IMAGING — CR DG CHEST 2V
1 series · 2 of 2 positions shown · non-contrast
Comparison: 05/19/2011

CLINICAL DATA: Cough fever and wheezing

EXAM:
CHEST  2 VIEW

[Series 1: dg chest 2 view · 0.14mm/px · 2 of 2 slices shown]
[im 1/2]
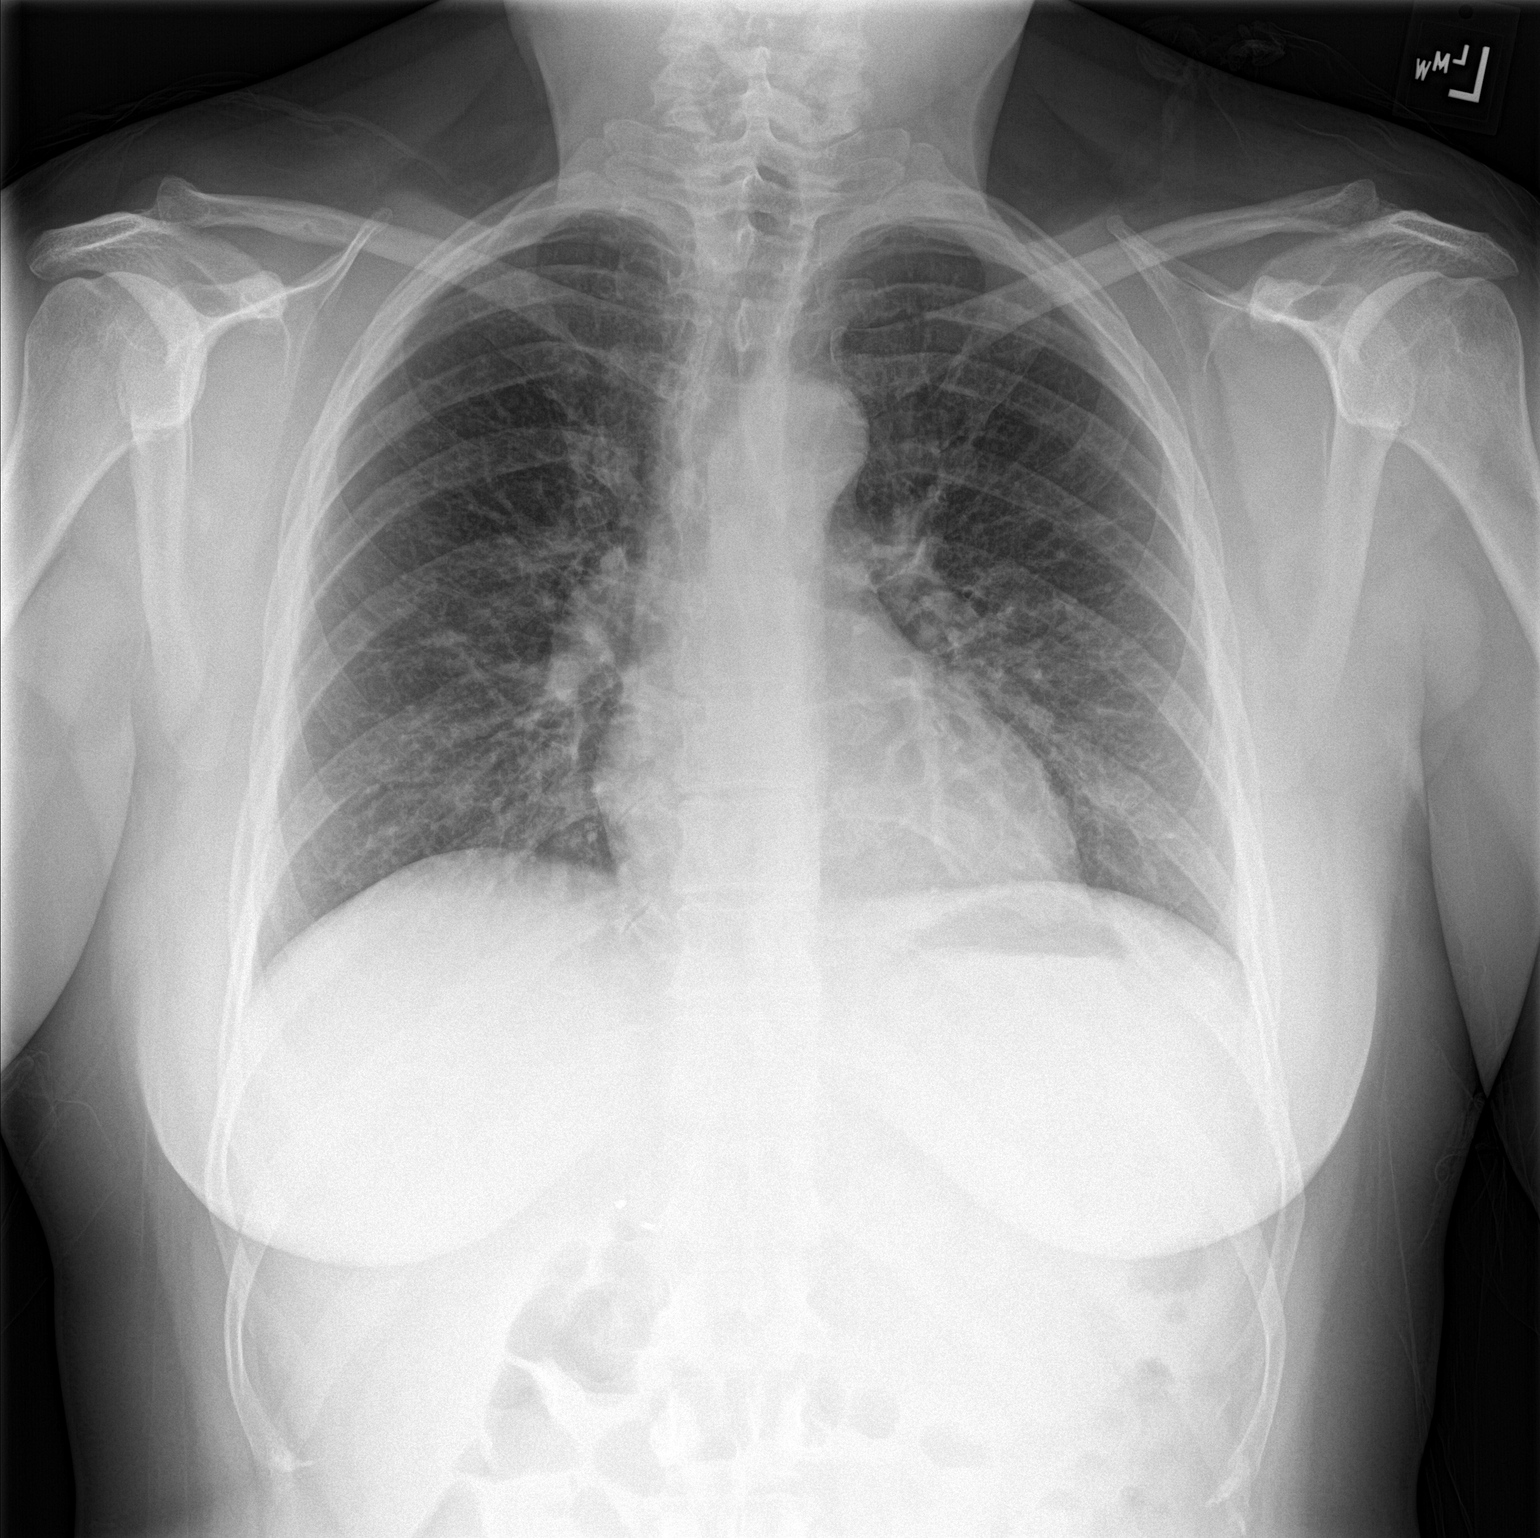
[im 2/2]
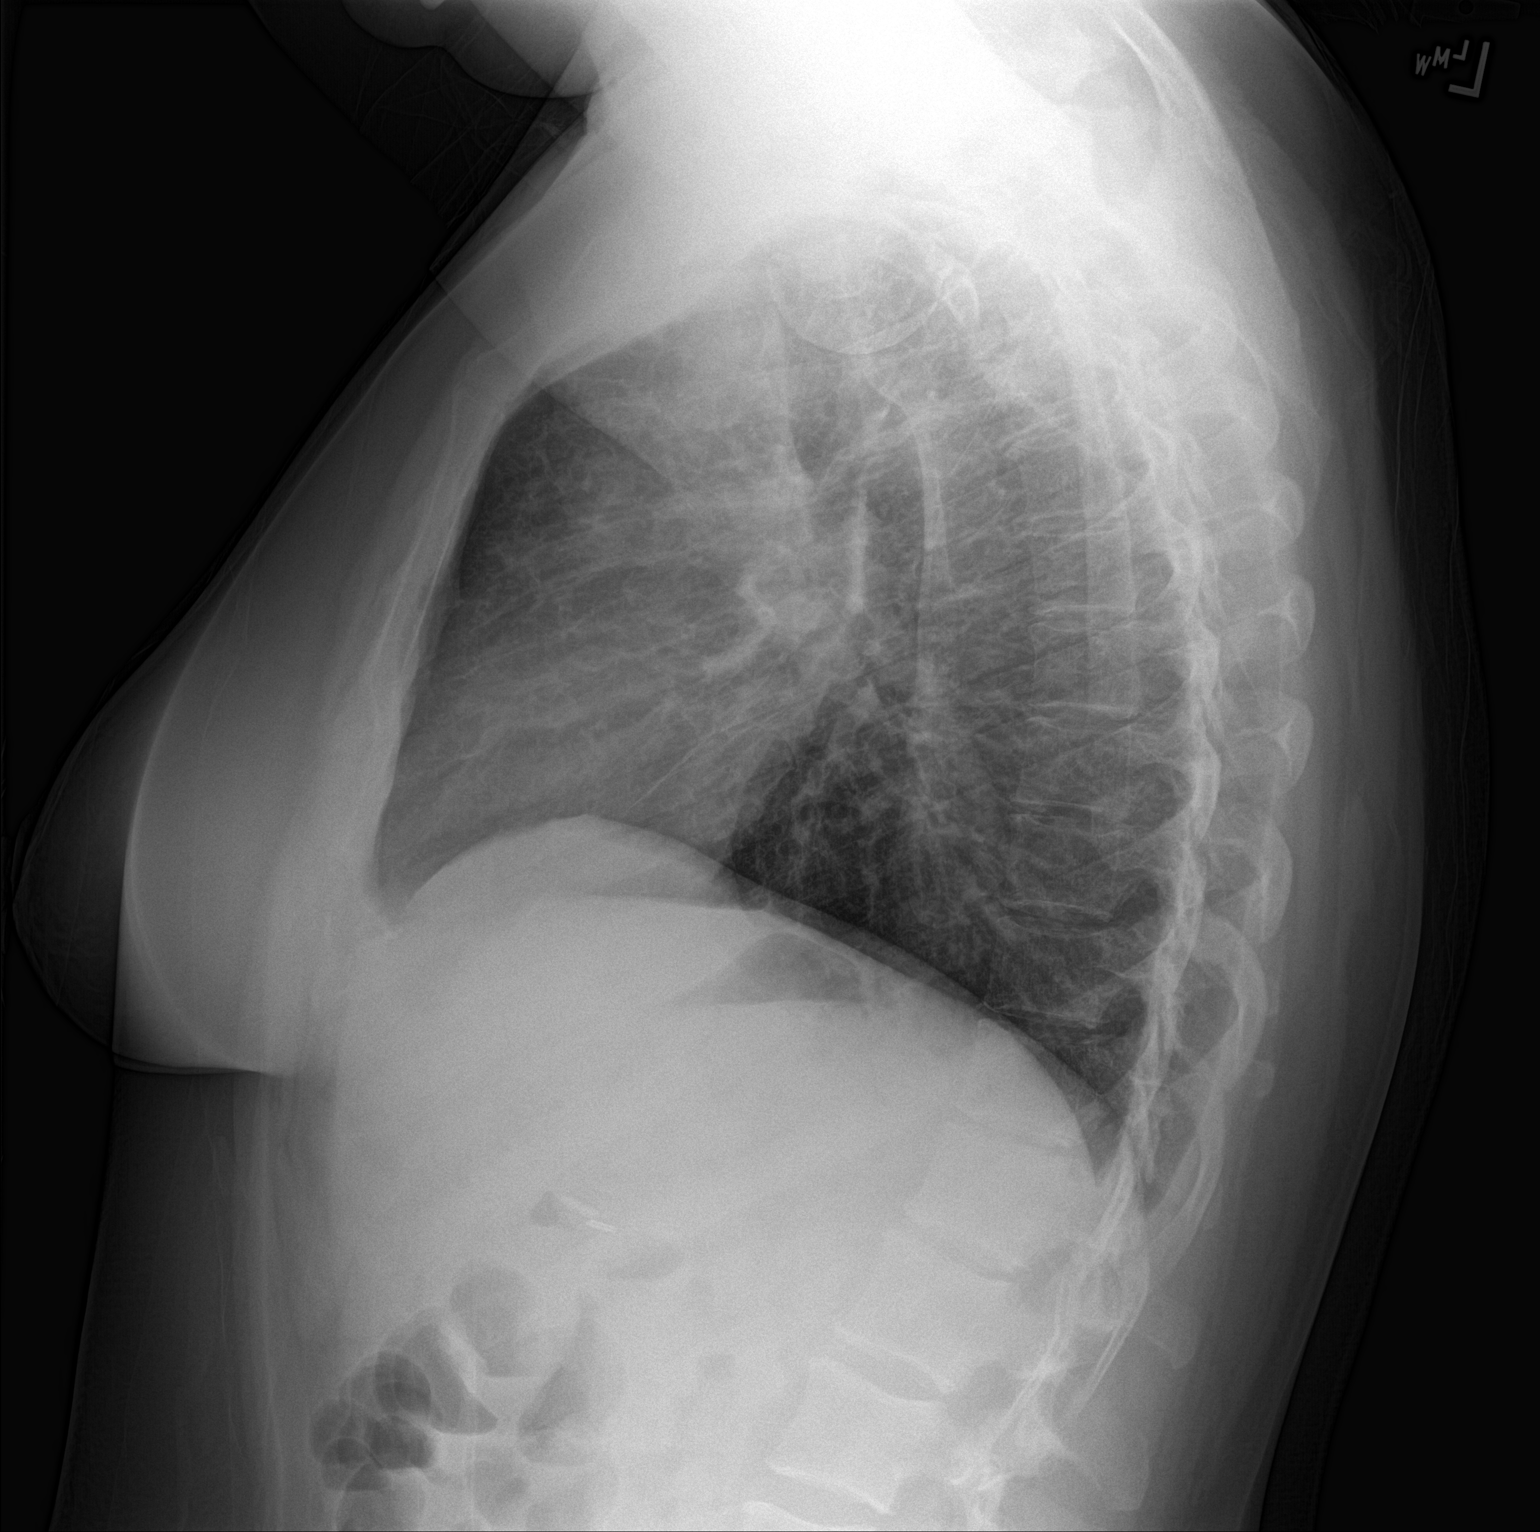

[2 of 2 positions shown; findings below may reference images not displayed]

FINDINGS: The heart size and mediastinal contours are within normal limits.
Both lungs are clear. The visualized skeletal structures are
unremarkable. Surgical clips in the right upper quadrant.
IMPRESSION: No active cardiopulmonary disease.

## 2022-02-05 ENCOUNTER — Emergency Department: Payer: Self-pay

## 2022-02-05 ENCOUNTER — Emergency Department
Admission: EM | Admit: 2022-02-05 | Discharge: 2022-02-05 | Disposition: A | Discharge status: Home / Self Care | Payer: Self-pay | Attending: Emergency Medicine | Admitting: Emergency Medicine

## 2022-02-05 ENCOUNTER — Encounter: Payer: Self-pay | Admitting: Emergency Medicine

## 2022-02-05 ENCOUNTER — Other Ambulatory Visit: Payer: Self-pay

## 2022-02-05 DIAGNOSIS — M79601 Pain in right arm: Secondary | ICD-10-CM | POA: Insufficient documentation

## 2022-02-05 DIAGNOSIS — K219 Gastro-esophageal reflux disease without esophagitis: Secondary | ICD-10-CM | POA: Insufficient documentation

## 2022-02-05 DIAGNOSIS — F1099 Alcohol use, unspecified with unspecified alcohol-induced disorder: Secondary | ICD-10-CM | POA: Insufficient documentation

## 2022-02-05 MED ORDER — OXYCODONE-ACETAMINOPHEN 5-325 MG PO TABS: 1.0000 | ORAL_TABLET | Freq: Once | ORAL | Status: DC

## 2022-02-05 MED ORDER — ACETAMINOPHEN 500 MG PO TABS
1000.0000 mg | ORAL_TABLET | Freq: Once | ORAL | Status: AC
  Administered 2022-02-05: 1000 mg via ORAL
  Filled 2022-02-05: qty 2

## 2022-02-05 MED ORDER — MELOXICAM 15 MG PO TABS: 15.0000 mg | ORAL_TABLET | Freq: Every day | ORAL | 11 refills | Status: AC

## 2022-02-05 MED ORDER — SUCRALFATE 1 G PO TABS: 1.0000 g | ORAL_TABLET | Freq: Three times a day (TID) | ORAL | 11 refills | Status: DC

## 2022-02-05 MED ORDER — OMEPRAZOLE MAGNESIUM 20 MG PO TBEC: 20.0000 mg | DELAYED_RELEASE_TABLET | Freq: Every day | ORAL | 0 refills | Status: DC

## 2022-02-05 MED ORDER — CYCLOBENZAPRINE HCL 10 MG PO TABS: 10.0000 mg | ORAL_TABLET | Freq: Three times a day (TID) | ORAL | 0 refills | Status: AC | PRN

## 2022-02-05 NOTE — Discharge Instructions (Addendum)
-  In regards to the pain in your shoulder/armpit, I suspect that this is likely musculoskeletal in nature.  There is not appear to be any signs of any serious or life-threatening conditions.  You may take the meloxicam and the cyclobenzaprine as needed.  Be careful with the cyclobenzaprine as it may make you dizzy/drowsy.  -In regards to your upper abdominal discomfort, I suspect this likely related to gastroesophageal reflux disease.  Please take the omeprazole and sucralfate as prescribed.  -Your chest x-ray did show significant interstitial thickening in your lungs, this is likely related to your smoking.  Please follow-up with the pulmonologist listed on this page.  Call them to schedule an appointment.  -Return to the emergency department anytime if you begin to experience any new or worsening symptoms.

## 2022-02-05 NOTE — ED Provider Notes (Signed)
[facility] Provider Note    Event Date/Time   First MD Initiated Contact with Patient 02/05/22 1547     (approximate)   History   Chief Complaint Arm Pain   HPI [name] is a 47 y.o. female, history of bipolar 2 disorder, cluster B personality disorder, alcohol use, MDD, anxiety, presents to the emergency department for evaluation of arm pain.  Patient states that she is having pain along the right side of her chest, particular in the armpit region, with some radiation towards the back.  She states that it hurts to take a big breath.  This been going on for the past week.  In addition, she states that she has a "lump" along the left side of her neck that has been there for few months.  In addition, she states that she is also having discomfort in her upper abdomen with frequent episodes of heartburn for the past few months as well.  She states that she would like to get all this checked out today.  Denies fever/chills, shortness of breath, abdominal pain, flank pain, diarrhea, urinary symptoms, headache, weakness, rashes/lesions, dizziness/lightheadedness, or paresthesias.  History Limitations: No limitations.        Physical Exam  Triage Vital Signs: ED Triage Vitals [02/05/22 1538]  Enc Vitals Group     BP (!) 164/98     Pulse Rate 90     Resp 18     Temp 98.4 F (36.9 C)     Temp Source Oral     SpO2 98 %     Weight      Height      Head Circumference      Peak Flow      Pain Score 8     Pain Loc      Pain Edu?      Excl. in [place]?     Most recent vital signs: Vitals:   02/05/22 1538  BP: (!) 164/98  Pulse: 90  Resp: 18  Temp: 98.4 F (36.9 C)  SpO2: 98%    General: Awake, NAD.  Skin: Warm, dry. No rashes or lesions.  Eyes: PERRL. Conjunctivae normal.  CV: Good peripheral perfusion.  S1 and S2 present.  No murmurs, rubs, or gallops.  No chest wall tenderness. Resp: Normal effort.  Coarse rhonchi in the apices and bases.  No  wheezing or rales. Abd: Soft, non-tender. No distention.  Neuro: At baseline. No gross neurological deficits.  Musculoskeletal: Normal ROM of all extremities.  Focused exam: There does appear to be some submandibular lymphadenopathy on the left side.  There examination unremarkable.  Uvula midline.  No tonsillar exudates.  No angioedema.  Physical Exam    ED Results / Procedures / Treatments  Labs (all labs ordered are listed, but only abnormal results are displayed) Labs Reviewed - No data to display   EKG Sinus rhythm, rate of 83, no segment changes, normal QRS, no QT prolongation, no significant axis deviations.    RADIOLOGY  ED Provider Interpretation: I personally reviewed and interpreted this x-ray, moderate pulmonary interstitial thickening noted.  DG Chest 2 View  Result Date: 02/05/2022 CLINICAL DATA:  Chest pain.  Current smoker. EXAM: CHEST - 2 VIEW COMPARISON:  12/03/2015 FINDINGS: Midline trachea. Normal heart size and mediastinal contours. No pleural effusion or pneumothorax. Moderate pulmonary interstitial thickening. Clear lungs. IMPRESSION: 1. No acute cardiopulmonary disease. 2. Moderate pulmonary interstitial thickening, likely due to smoking/chronic bronchitis. Electronically Signed   By: Marylyn Ishihara  Jobe Igo M.D.   On: 02/05/2022 16:18    PROCEDURES:  Critical Care performed: N/A.  Procedures    MEDICATIONS ORDERED IN ED: Medications  acetaminophen (TYLENOL) tablet 1,000 mg (1,000 mg Oral Given 02/05/22 1636)     IMPRESSION / MDM / ASSESSMENT AND PLAN / ED COURSE  I reviewed the triage vital signs and the nursing notes.                              Differential diagnosis includes, but is not limited to, community-acquired pneumonia, GERD, gastritis, viral respiratory infection, chronic bronchitis, intercostal muscle strain.  Assessment/Plan Patient presents with several complaints, including pain in her right armpit x 1 week, epigastric/upper abdominal  discomfort x 3 months, and a "lump" on the left side of her neck x 3 months.  In regards to her armpit pain, there is not appear to be any overlying rashes/lesions.  No signs of infection.  She denies any recent falls or injuries.  Chest x-ray is clear, although she does have moderate interstitial pulmonary thickening.  This is consistent with my physical exam findings, she did have significant rhonchi on auscultation.  She states that she has been smoking for the past 25 years, approximately 1-1/2 packs of cigarettes a day.  Will provide her with a referral to pulmonology for further evaluation.  Her EKG is unremarkable.  Unclear the exact etiology of her pain, though it does not appear to be serious or life-threatening at this time.  She is PERC negative.  In regards to her epigastric/upper abdominal discomfort, there is not appear to be any tenderness on exam.  Given the concomitant nature with her heartburn, I suspect this is likely due to GERD.  Will provide her with prescription for omeprazole and sucralfate they will manage her symptoms.  In regards to the lump on her neck, this appears to be submandibular lymphadenopathy, likely benign versus related to viral respiratory infection.  I suspect it will likely go away on its own.  Provide her with reassurance.  Encouraged her to follow-up with her primary care provider and the pulmonology referral.  Will discharge.  Provided the patient with anticipatory guidance, return precautions, and educational material. Encouraged the patient to return to the emergency department at any time if they begin to experience any new or worsening symptoms. Patient expressed understanding and agreed with the plan.   Patient's presentation is most consistent with acute complicated illness / injury requiring diagnostic workup.       FINAL CLINICAL IMPRESSION(S) / ED DIAGNOSES   Final diagnoses:  Right arm pain  Gastroesophageal reflux disease, unspecified whether  esophagitis present     Rx / DC Orders   ED Discharge Orders          Ordered    omeprazole (PRILOSEC OTC) 20 MG tablet  Daily        02/05/22 1649    sucralfate (CARAFATE) 1 g tablet  3 times daily        02/05/22 1649    meloxicam (MOBIC) 15 MG tablet  Daily        02/05/22 1649    cyclobenzaprine (FLEXERIL) 10 MG tablet  3 times daily PRN        02/05/22 1649             Note:  This document was prepared using Dragon voice recognition software and may include unintentional dictation errors.   Teodoro Spray,  PA 02/05/22 1652    Lucillie Garfinkel, MD 02/05/22 2329

## 2022-02-05 NOTE — ED Triage Notes (Signed)
Patient to ED for pain under right arm pit- states pain radiates into back. Ongoing pain for the past week. Also, states throat swollen on left side for a month. Throat does not hurt and no difficulties breathing.

## 2023-09-13 ENCOUNTER — Ambulatory Visit (INDEPENDENT_AMBULATORY_CARE_PROVIDER_SITE_OTHER): Payer: MEDICAID | Admitting: Family Medicine

## 2023-09-13 ENCOUNTER — Encounter: Payer: Self-pay | Admitting: Family Medicine

## 2023-09-13 VITALS — BP 118/82 | HR 93 | Ht 67.0 in | Wt 203.2 lb

## 2023-09-13 DIAGNOSIS — F900 Attention-deficit hyperactivity disorder, predominantly inattentive type: Secondary | ICD-10-CM | POA: Diagnosis not present

## 2023-09-13 DIAGNOSIS — Z7689 Persons encountering health services in other specified circumstances: Secondary | ICD-10-CM | POA: Diagnosis not present

## 2023-09-13 DIAGNOSIS — F3181 Bipolar II disorder: Secondary | ICD-10-CM | POA: Diagnosis not present

## 2023-09-13 DIAGNOSIS — F6089 Other specific personality disorders: Secondary | ICD-10-CM

## 2023-09-13 DIAGNOSIS — F3341 Major depressive disorder, recurrent, in partial remission: Secondary | ICD-10-CM

## 2023-09-13 MED ORDER — DEXMETHYLPHENIDATE HCL ER 15 MG PO CP24: 15.0000 mg | ORAL_CAPSULE | Freq: Every day | ORAL | 0 refills | Status: DC

## 2023-09-13 NOTE — Progress Notes (Unsigned)
 Subjective:    Patient ID: [name], female    DOB: [birth year], 48 y.o.   MRN: [id number]  [name] is a 48 y.o. female presenting on 09/13/2023 for Establish Care   HPI  Discussed the use of AI scribe software for clinical note transcription with the patient, who gave verbal consent to proceed.  History of Present Illness   Relocated from [facility] [place] back to [place] Now established with new medicaid insurance  Major Depression, recurrent ADHD [facility]  ***Focalin  XR  ***Alcohol History  Health Maintenance: ***     09/13/2023    8:59 AM  Depression screen PHQ 2/9  Decreased Interest 1  Down, Depressed, Hopeless 0  PHQ - 2 Score 1  Altered sleeping 0  Tired, decreased energy 1  Change in appetite 1  Feeling bad or failure about yourself  0  Trouble concentrating 3  Moving slowly or fidgety/restless 3  Suicidal thoughts 0  PHQ-9 Score 9  Difficult doing work/chores Somewhat difficult       09/13/2023    9:00 AM  GAD 7 : Generalized Anxiety Score  Nervous, Anxious, on Edge 0  Control/stop worrying 0  Worry too much - different things 0  Trouble relaxing 1  Restless 2  Easily annoyed or irritable 2  Afraid - awful might happen 0  Total GAD 7 Score 5  Anxiety Difficulty Somewhat difficult     Past Medical History:  Diagnosis Date   Anxiety    per pt report    Bipolar disorder (HCC)    per pt report   Depression    Mental disorder    Past Surgical History:  Procedure Laterality Date   ABDOMINAL HYSTERECTOMY     CHOLECYSTECTOMY     Social History   Socioeconomic History   Marital status: Married    Spouse name: Not on file   Number of children: Not on file   Years of education: Not on file   Highest education level: Not on file  Occupational History   Not on file  Tobacco Use   Smoking status: Every Day    Current packs/day: 1.00    Average packs/day: 1 pack/day for 16.0 years (16.0 ttl pk-yrs)     Types: Cigarettes   Smokeless tobacco: Never   Tobacco comments:    given info about patches and 2 wk free supply after discharge  Vaping Use   Vaping status: Never Used  Substance and Sexual Activity   Alcohol use: Yes    Comment: 48 oz beer, 1 bottle of wine, close to a 1/5th of vodka    Drug use: Not Currently    Types: Marijuana    Comment: THC- occ use    Sexual activity: Yes    Birth control/protection: Surgical  Other Topics Concern   Not on file  Social History Narrative   Not on file   Social Drivers of Health   Financial Resource Strain: High Risk (07/28/2021)   Received from [facility]   Overall Financial Resource Strain (CARDIA)    Difficulty of Paying Living Expenses: Hard  Food Insecurity: No Food Insecurity (07/28/2021)   Received from [facility]   Hunger Vital Sign    Within the past 12 months, you worried that your food would run out before you got the money to buy more.: Never true    Within the past 12 months, the food you bought just didn't last and you didn't  have money to get more.: Never true  Transportation Needs: No Transportation Needs (07/28/2020)   Received from [facility] - Transportation    Lack of Transportation (Medical): No    Lack of Transportation (Non-Medical): No  Physical Activity: Inactive (07/28/2021)   Received from [facility]   Exercise Vital Sign    On average, how many days per week do you engage in moderate to strenuous exercise (like a brisk walk)?: 0 days    On average, how many minutes do you engage in exercise at this level?: 0 min  Stress: Stress Concern Present (07/28/2020)   Received from [facility] - Occupational Stress Questionnaire    Feeling of Stress : Rather much  Social Connections: Socially Isolated (07/28/2021)   Received from [facility]   Social Connection and Isolation Panel    In a typical week, how many times do you talk on  the phone with family, friends, or neighbors?: More than three times a week    How often do you get together with friends or relatives?: More than three times a week    How often do you attend church or religious services?: Never    Do you belong to any clubs or organizations such as church groups, unions, fraternal or athletic groups, or school groups?: No    How often do you attend meetings of the clubs or organizations you belong to?: Never    Are you married, widowed, divorced, separated, never married, or living with a partner?: Separated  Intimate Partner Violence: Not At Risk (07/28/2021)   Received from [facility]   Humiliation, Afraid, Rape, and Kick questionnaire    Within the last year, have you been afraid of your partner or ex-partner?: No    Within the last year, have you been humiliated or emotionally abused in other ways by your partner or ex-partner?: No    Within the last year, have you been kicked, hit, slapped, or otherwise physically hurt by your partner or ex-partner?: No    Within the last year, have you been raped or forced to have any kind of sexual activity by your partner or ex-partner?: No   Family History  Problem Relation Age of Onset   Heart attack Mother    Melanoma Father    Healthy Sister    Brain cancer Son    Current Outpatient Medications on File Prior to Visit  Medication Sig   ARIPiprazole  (ABILIFY ) 15 MG tablet Take 1 tablet (15 mg total) by mouth daily. For mood stability   sertraline (ZOLOFT) 50 MG tablet Take 50 mg by mouth daily.   No current facility-administered medications on file prior to visit.    Review of Systems Per HPI unless specifically indicated above     Objective:    BP 118/82 (BP Location: Left Arm, Patient Position: Sitting, Cuff Size: Normal)   Pulse 93   Ht 5' 7 (1.702 m)   Wt 203 lb 4 oz (92.2 kg)   SpO2 93%   BMI 31.83 kg/m   Wt Readings from Last 3 Encounters:  09/13/23 203 lb 4 oz (92.2 kg)  01/21/17  215 lb (97.5 kg)  08/03/16 215 lb (97.5 kg)    Physical Exam    Results for orders placed or performed during the hospital encounter of 01/21/17  Comprehensive metabolic panel   Collection Time: 01/21/17  8:04 PM  Result Value Ref Range  Sodium 140 135 - 145 mmol/L   Potassium 3.5 3.5 - 5.1 mmol/L   Chloride 100 (L) 101 - 111 mmol/L   CO2 30 22 - 32 mmol/L   Glucose, Bld 115 (H) 65 - 99 mg/dL   BUN 7 6 - 20 mg/dL   Creatinine, Ser 9.25 0.44 - 1.00 mg/dL   Calcium 9.2 8.9 - 89.6 mg/dL   Total Protein 8.2 (H) 6.5 - 8.1 g/dL   Albumin 4.4 3.5 - 5.0 g/dL   AST 29 15 - 41 U/L   ALT 25 14 - 54 U/L   Alkaline Phosphatase 72 38 - 126 U/L   Total Bilirubin 0.6 0.3 - 1.2 mg/dL   GFR calc non Af Amer >60 >60 mL/min   GFR calc Af Amer >60 >60 mL/min   Anion gap 10 5 - 15  Ethanol   Collection Time: 01/21/17  8:04 PM  Result Value Ref Range   Alcohol, Ethyl (B) 23 (H) <10 mg/dL  Salicylate level   Collection Time: 01/21/17  8:04 PM  Result Value Ref Range   Salicylate Lvl <7.0 2.8 - 30.0 mg/dL  Acetaminophen  level   Collection Time: 01/21/17  8:04 PM  Result Value Ref Range   Acetaminophen  (Tylenol ), Serum <10 (L) 10 - 30 ug/mL  cbc   Collection Time: 01/21/17  8:04 PM  Result Value Ref Range   WBC 11.6 (H) 3.6 - 11.0 K/uL   RBC 5.01 3.80 - 5.20 MIL/uL   Hemoglobin 15.6 12.0 - 16.0 g/dL   HCT 53.6 64.9 - 52.9 %   MCV 92.4 80.0 - 100.0 fL   MCH 31.0 26.0 - 34.0 pg   MCHC 33.6 32.0 - 36.0 g/dL   RDW 85.8 88.4 - 85.4 %   Platelets 307 150 - 440 K/uL  Urine Drug Screen, Qualitative   Collection Time: 01/21/17  8:04 PM  Result Value Ref Range   Tricyclic, Ur Screen NONE DETECTED NONE DETECTED   Amphetamines, Ur Screen NONE DETECTED NONE DETECTED   MDMA (Ecstasy)Ur Screen NONE DETECTED NONE DETECTED   Cocaine Metabolite,Ur [place] NONE DETECTED NONE DETECTED   Opiate, Ur Screen NONE DETECTED NONE DETECTED   Phencyclidine (PCP) Ur S NONE DETECTED NONE DETECTED   Cannabinoid 50  Ng, Ur [place] POSITIVE (A) NONE DETECTED   Barbiturates, Ur Screen NONE DETECTED NONE DETECTED   Benzodiazepine, Ur Scrn NONE DETECTED NONE DETECTED   Methadone Scn, Ur NONE DETECTED NONE DETECTED  Pregnancy, urine POC   Collection Time: 01/21/17  8:38 PM  Result Value Ref Range   Preg Test, Ur NEGATIVE NEGATIVE      Assessment & Plan:   Problem List Items Addressed This Visit     Bipolar II disorder (HCC) (Chronic)   Cluster B personality disorder (HCC)   Major depressive disorder, recurrent, in partial remission (HCC) - Primary   Relevant Medications   sertraline (ZOLOFT) 50 MG tablet   Other Visit Diagnoses       Attention deficit hyperactivity disorder (ADHD), predominantly inattentive type       Relevant Medications   dexmethylphenidate  (FOCALIN  XR) 15 MG 24 hr capsule        Updated Health Maintenance information ***- Reviewed recent lab results with patient Encouraged improvement to lifestyle with diet and exercise -*** Goal of weight loss  Assessment and Plan Assessment & Plan      No orders of the defined types were placed in this encounter.   Meds ordered this encounter  Medications  dexmethylphenidate  (FOCALIN  XR) 15 MG 24 hr capsule    Sig: Take 1 capsule (15 mg total) by mouth daily.    Dispense:  30 capsule    Refill:  0     Follow up plan: Return for 1-2 month Annual Physical, fasting lab AFTER AM.  Marsa Officer, DO [facility] 09/13/2023, 9:16 AM

## 2023-09-13 NOTE — Patient Instructions (Addendum)
 Thank you for coming to the office today.  Ordered Focalin  XR 15mg  for 30 day. Please let me know if this is working well.  Have [facility] send us  copy of records. They will manage the other 2 medications.  Please schedule a Follow-up Appointment to: Return for 1-2 month Annual Physical, fasting lab AFTER AM.  If you have any other questions or concerns, please feel free to call the office or send a message through MyChart. You may also schedule an earlier appointment if necessary.  Additionally, you may be receiving a survey about your experience at our office within a few days to 1 week by e-mail or mail. We value your feedback.  Marsa Officer, DO [facility], [place]

## 2023-09-18 ENCOUNTER — Telehealth: Payer: Self-pay

## 2023-09-18 NOTE — Telephone Encounter (Signed)
 Copied from CRM [id number]. Topic: Clinical - Medication Question >> Sep 18, 2023  1:24 PM Tobias L wrote: Reason for CRM: Patient inquiring if vyvanse  could be called in instead of the focalin  xr. The focalin  is making patient sleepy and groggy.   Please advise, [phone]   Spoke with patient scheduled an appointment to be seen by Dr. Edman

## 2023-09-19 ENCOUNTER — Ambulatory Visit (INDEPENDENT_AMBULATORY_CARE_PROVIDER_SITE_OTHER): Payer: MEDICAID | Admitting: Family Medicine

## 2023-09-19 ENCOUNTER — Encounter: Payer: Self-pay | Admitting: Family Medicine

## 2023-09-19 VITALS — BP 132/84 | HR 81 | Ht 67.0 in | Wt 204.2 lb

## 2023-09-19 DIAGNOSIS — F900 Attention-deficit hyperactivity disorder, predominantly inattentive type: Secondary | ICD-10-CM

## 2023-09-19 DIAGNOSIS — J029 Acute pharyngitis, unspecified: Secondary | ICD-10-CM | POA: Diagnosis not present

## 2023-09-19 DIAGNOSIS — J441 Chronic obstructive pulmonary disease with (acute) exacerbation: Secondary | ICD-10-CM

## 2023-09-19 LAB — POC COVID19 BINAXNOW: SARS Coronavirus 2 Ag: NEGATIVE

## 2023-09-19 MED ORDER — ALBUTEROL SULFATE HFA 108 (90 BASE) MCG/ACT IN AERS: 2.0000 | INHALATION_SPRAY | Freq: Four times a day (QID) | RESPIRATORY_TRACT | 0 refills | Status: AC | PRN

## 2023-09-19 MED ORDER — PREDNISONE 50 MG PO TABS: 50.0000 mg | ORAL_TABLET | Freq: Every day | ORAL | 0 refills | Status: DC

## 2023-09-19 MED ORDER — LEVOFLOXACIN 500 MG PO TABS: 500.0000 mg | ORAL_TABLET | Freq: Every day | ORAL | 0 refills | Status: DC

## 2023-09-19 MED ORDER — VYVANSE 30 MG PO CAPS: 30.0000 mg | ORAL_CAPSULE | Freq: Every day | ORAL | 0 refills | Status: DC

## 2023-09-19 NOTE — Patient Instructions (Signed)
   Please schedule a Follow-up Appointment to: No follow-ups on file.  If you have any other questions or concerns, please feel free to call the office or send a message through MyChart. You may also schedule an earlier appointment if necessary.  Additionally, you may be receiving a survey about your experience at our office within a few days to 1 week by e-mail or mail. We value your feedback.  Saralyn Pilar, DO [facility], [place]

## 2023-09-19 NOTE — Progress Notes (Signed)
 Subjective:    Patient ID: [name], female    DOB: 03-27-1975, 48 y.o.   MRN: [id number]  [name] is a 48 y.o. female presenting on 09/19/2023 for Chest Congestion and Sore Throat   HPI  Discussed the use of AI scribe software for clinical note transcription with the patient, who gave verbal consent to proceed.  History of Present Illness   [name] is a 48 year old female who presents with chest pain and respiratory symptoms.  Acute COPD Exacerbation No clear diagnosis of Emphysema in past, need to review or request new imaging diagnostics Currently with atyipcal Chest pain and respiratory symptoms with wheezing cough - Associated with shortness of breath and persistent cough - Cough is exacerbated by smoking - Chest congestion present - No history of asthma or COPD - No use of rescue inhaler - Sore throat began last week - Difficulty talking over the past few days - Headache present - No COVID test performed previously   Tobacco use - Current smoker - Unable to smoke without coughing - Has attempted to reduce smoking during current illness  Medication use and symptom management - Intermittent use of Mucinex cold and flu liquid and Tylenol  for symptom relief - Recently picked up Focalin  - however ineffective - Inquires about switching to Vyvanse , previously took 30 mg       09/19/2023    9:33 AM 09/13/2023    8:59 AM  Depression screen PHQ 2/9  Decreased Interest 0 1  Down, Depressed, Hopeless 0 0  PHQ - 2 Score 0 1  Altered sleeping 0 0  Tired, decreased energy 1 1  Change in appetite 1 1  Feeling bad or failure about yourself  0 0  Trouble concentrating 1 3  Moving slowly or fidgety/restless 0 3  Suicidal thoughts 0 0  PHQ-9 Score 3 9  Difficult doing work/chores Not difficult at all Somewhat difficult       09/19/2023    9:33 AM 09/13/2023    9:00 AM  GAD 7 : Generalized Anxiety Score  Nervous, Anxious, on Edge 0 0  Control/stop  worrying 0 0  Worry too much - different things 0 0  Trouble relaxing 0 1  Restless 0 2  Easily annoyed or irritable 0 2  Afraid - awful might happen 0 0  Total GAD 7 Score 0 5  Anxiety Difficulty  Somewhat difficult    Social History   Tobacco Use   Smoking status: Every Day    Current packs/day: 1.00    Average packs/day: 1 pack/day for 16.0 years (16.0 ttl pk-yrs)    Types: Cigarettes   Smokeless tobacco: Never   Tobacco comments:    given info about patches and 2 wk free supply after discharge  Vaping Use   Vaping status: Never Used  Substance Use Topics   Alcohol use: Yes    Comment: 48 oz beer, 1 bottle of wine, close to a 1/5th of vodka    Drug use: Not Currently    Types: Marijuana    Comment: THC- occ use     Review of Systems Per HPI unless specifically indicated above     Objective:    BP 132/84 (BP Location: Left Arm, Patient Position: Sitting, Cuff Size: Normal)   Pulse 81   Ht 5' 7 (1.702 m)   Wt 204 lb 4 oz (92.6 kg)   SpO2 96%   BMI 31.99 kg/m   Wt Readings from Last 3 Encounters:  09/19/23 204 lb 4 oz (92.6 kg)  09/13/23 203 lb 4 oz (92.2 kg)  01/21/17 215 lb (97.5 kg)    Physical Exam Vitals and nursing note reviewed.  Constitutional:      General: She is not in acute distress.    Appearance: She is well-developed. She is not diaphoretic.     Comments: Tired appearing, comfortable, cooperative  HENT:     Head: Normocephalic and atraumatic.  Eyes:     General:        Right eye: No discharge.        Left eye: No discharge.     Conjunctiva/sclera: Conjunctivae normal.  Neck:     Thyroid: No thyromegaly.  Cardiovascular:     Rate and Rhythm: Normal rate and regular rhythm.     Heart sounds: Normal heart sounds. No murmur heard. Pulmonary:     Effort: Pulmonary effort is normal. No respiratory distress.     Breath sounds: Wheezing present. No rales.     Comments: coughing Chest:     Chest wall: Tenderness present.  Musculoskeletal:         General: Normal range of motion.     Cervical back: Normal range of motion and neck supple.  Lymphadenopathy:     Cervical: No cervical adenopathy.  Skin:    General: Skin is warm and dry.     Findings: No erythema or rash.  Neurological:     Mental Status: She is alert and oriented to person, place, and time.  Psychiatric:        Behavior: Behavior normal.     Comments: Well groomed, good eye contact, normal speech and thoughts     Results for orders placed or performed in visit on 09/19/23  POC COVID-19 BinaxNow   Collection Time: 09/19/23 10:00 AM  Result Value Ref Range   SARS Coronavirus 2 Ag Negative Negative      Assessment & Plan:   Problem List Items Addressed This Visit   None Visit Diagnoses       COPD with acute exacerbation (HCC)    -  Primary   Relevant Medications   levofloxacin  (LEVAQUIN ) 500 MG tablet   predniSONE  (DELTASONE ) 50 MG tablet   albuterol  (VENTOLIN  HFA) 108 (90 Base) MCG/ACT inhaler     Attention deficit hyperactivity disorder (ADHD), predominantly inattentive type       Relevant Medications   VYVANSE  30 MG capsule     Sore throat       Relevant Orders   POC COVID-19 BinaxNow (Completed)        Acute COPD exacerbation Atypical Chest Wall pain, shortness of breath, and wheezing suggest acute COPD exacerbation, likely triggered by viral infection or COVID-19. Smoking history is a contributing factor. - Administer COVID-19 test. negative - Prescribe prednisone . - Prescribe antibiotic levaquin  - Prescribe albuterol  rescue inhaler. She will need maintenance therapy for COPD in future Check CXR if not improving. Future consider CT  Nicotine  dependence Continues smoking despite respiratory symptoms.  Attention-deficit hyperactivity disorder (ADHD) Requests switch from Focalin  to Vyvanse , previously effective at 30 mg. - Discontinue Focalin . - Prescribe Vyvanse  30 mg. - Informed her of potential delay at pharmacy for Vyvanse   switch.        Orders Placed This Encounter  Procedures   POC COVID-19 BinaxNow    Meds ordered this encounter  Medications   levofloxacin  (LEVAQUIN ) 500 MG tablet    Sig: Take 1 tablet (500 mg total) by mouth daily. For 7 days  Dispense:  7 tablet    Refill:  0   predniSONE  (DELTASONE ) 50 MG tablet    Sig: Take 1 tablet (50 mg total) by mouth daily with breakfast.    Dispense:  5 tablet    Refill:  0   albuterol  (VENTOLIN  HFA) 108 (90 Base) MCG/ACT inhaler    Sig: Inhale 2 puffs into the lungs every 6 (six) hours as needed for wheezing or shortness of breath.    Dispense:  8 g    Refill:  0   VYVANSE  30 MG capsule    Sig: Take 1 capsule (30 mg total) by mouth daily.    Dispense:  30 capsule    Refill:  0    Switch from Focalin  XR to Vyvanse     Follow up plan: Return if symptoms worsen or fail to improve.   Marsa Officer, DO [facility] 09/19/2023, 9:56 AM

## 2023-09-21 ENCOUNTER — Other Ambulatory Visit (HOSPITAL_COMMUNITY): Payer: Self-pay

## 2023-09-25 ENCOUNTER — Ambulatory Visit: Payer: Self-pay

## 2023-09-25 NOTE — Telephone Encounter (Signed)
 FYI Only or Action Required?: FYI only for provider.  Patient was last seen in primary care on 09/19/2023 by Edman Marsa PARAS, DO.  Called Nurse Triage reporting Cough.  Symptoms began 09/19/23.  Interventions attempted: Prescription medications: abx and inhaler.  Symptoms are: gradually worsening.  Triage Disposition: See HCP Within 4 Hours (Or PCP Triage)  Patient/caregiver understands and will follow disposition?: Yes       Message from [facility] C sent at 09/25/2023  3:11 PM EDT   Dr. MARLA gave antibotiocs and an inhaler she still feels bad even after taking all of it She believes something is wrong with her trachia and its hard to swallow. She would like to know if she needs to be seen again or given different medication     Reason for Disposition . [1] MILD difficulty breathing (e.g., minimal/no SOB at rest, SOB with walking, pulse < 100) AND [2] still present when not coughing  Answer Assessment - Initial Assessment Questions 1. ONSET: When did the cough begin?      09/19/23  3. SPUTUM: Describe the color of your sputum (e.g., none, dry cough; clear, white, yellow, green)     Clear hint of green  5. DIFFICULTY BREATHING: Are you having difficulty breathing? If Yes, ask: How bad is it? (e.g., mild, moderate, severe)      Yes/mild 6. FEVER: Do you have a fever? If Yes, ask: What is your temperature, how was it measured, and when did it start?    Hot and cold symptoms  8. LUNG HISTORY: Do you have any history of lung disease?  (e.g., pulmonary embolus, asthma, emphysema)     COPD  10. OTHER SYMPTOMS: Do you have any other symptoms? (e.g., runny nose, wheezing, chest pain)       Hoarse, SOB with inhaler  Protocols used: Cough - Acute Productive-A-AH

## 2023-09-25 NOTE — Telephone Encounter (Signed)
 1st attempt, no answer. Left voicemail for patient to return call for nurse triage.  Message from [place] C sent at 09/25/2023  3:11 PM EDT  Dr. MARLA gave antibotiocs and an inhaler she still feels bad even after taking all of it She believes something is wrong with her trachia and its hard to swallow. She would like to know if she needs to be seen again or given different medication

## 2023-10-05 ENCOUNTER — Telehealth: Payer: Self-pay

## 2023-10-05 DIAGNOSIS — F900 Attention-deficit hyperactivity disorder, predominantly inattentive type: Secondary | ICD-10-CM

## 2023-10-05 MED ORDER — LISDEXAMFETAMINE DIMESYLATE 50 MG PO CAPS: 50.0000 mg | ORAL_CAPSULE | Freq: Every day | ORAL | 0 refills | Status: DC

## 2023-10-05 NOTE — Addendum Note (Signed)
 Addended by: EDMAN MARSA PARAS on: 10/05/2023 12:44 PM   Modules accepted: Orders

## 2023-10-05 NOTE — Telephone Encounter (Signed)
 Patient notified increase of medication was sent in for her to the [facility] in [place].

## 2023-10-05 NOTE — Telephone Encounter (Signed)
 Dose increase from 30 to 50mg  sent to requested pharmacy 30 day, she can contact us  back for further refills if this is working well  Marsa Officer, DO [facility] 10/05/2023, 12:44 PM

## 2023-10-05 NOTE — Telephone Encounter (Signed)
 Copied from CRM #[id number]. Topic: Clinical - Medication Question >> Oct 05, 2023  9:05 AM Wess RAMAN wrote: Reason for CRM: Patient would like to increase VYVANSE  30 MG capsule to 50MG  due to it not working  Callback #: [phone]  Pharmacy: [facility] [address] [place], [place] [facility] [address] [place] [address] Phone: [phone] Fax: [phone] Hours: Not open 24 hours

## 2023-10-20 ENCOUNTER — Ambulatory Visit: Payer: Self-pay

## 2023-10-20 NOTE — Telephone Encounter (Signed)
 Please call patient back to clarify.  I see she has an apt on 11/7 for annual physical, we cannot fully address this type of topic during a physical, it should be a separate discussion / appointment.  I am okay to go ahead and stop Vyvanse  and switch to Adderall.  However, I am concerned about the dosage. 30mg  is the max dose and twice a day at 30mg  is a very high dose.  I see she continues to receive mental health for other conditions through Psychiatry at [facility], virtually.  I believe that they are not managing her ADHD.  Could you let her know that I would typically manage patients on Adderall ranging from 10-20mg  dosing. On average I usually start 10 or 15mg  and adjust from there. Occasional 20 mg. But 30mg  is max dose, I would not recommend this rx without consultation from Psychiatry first.  If she wants to talk more we can setup a virtual visit or I can order 20mg  and see how she does.  Marsa Officer, DO [facility] 10/20/2023, 5:19 PM

## 2023-10-20 NOTE — Telephone Encounter (Signed)
 FYI Only or Action Required?: Action required by provider: Requesting that Vyvanse  be discontinued and switched to Adderall 30mg  BID.  Patient was last seen in primary care on 09/19/2023 by Edman Marsa PARAS, DO.  Called Nurse Triage reporting Medication Reaction. HA and hand tremors.  Symptoms began After re-starting Vyvanse .  Interventions attempted: Nothing.  Symptoms are: unchanged.  Triage Disposition: See PCP When Office is Open (Within 3 Days)  Patient/caregiver understands and will follow disposition?: Yes                       Copied from CRM #[id number]. Topic: Clinical - Red Word Triage >> Oct 20, 2023  3:26 PM [name] wrote: Red Word that prompted transfer to Nurse Triage: patient having a reaction to lisdexamfetamine (VYVANSE ) 50 MG capsule. Headaches, hands feel like she has tremors Reason for Disposition  Prescription request for new medicine (not a refill)  Answer Assessment - Initial Assessment Questions Pt states she is having HA and hand tremors d/t Vyvanse . She had taken this medication in th past and had the same side effects. Pt would like to be switched to Adderall 30 mg BID. That medication worked better for her w/o side effects.    1. NAME of MEDICINE: What medicine(s) are you calling about?     Vyvanse  2. QUESTION: What is your question? (e.g., double dose of medicine, side effect)     Causing side effects of HA, hand tremors 3. PRESCRIBER: Who prescribed the medicine? Reason: if prescribed by specialist, call should be referred to that group.     Dr. Henriette 4. SYMPTOMS: Do you have any symptoms? If Yes, ask: What symptoms are you having?  How bad are the symptoms (e.g., mild, moderate, severe)     HA, and hand tremors  Protocols used: Medication Question Call-A-AH

## 2023-10-23 ENCOUNTER — Telehealth: Payer: MEDICAID | Admitting: Family Medicine

## 2023-10-23 ENCOUNTER — Telehealth: Payer: Self-pay

## 2023-10-23 ENCOUNTER — Encounter: Payer: Self-pay | Admitting: Family Medicine

## 2023-10-23 DIAGNOSIS — F3181 Bipolar II disorder: Secondary | ICD-10-CM | POA: Diagnosis not present

## 2023-10-23 DIAGNOSIS — F900 Attention-deficit hyperactivity disorder, predominantly inattentive type: Secondary | ICD-10-CM | POA: Diagnosis not present

## 2023-10-23 MED ORDER — AMPHETAMINE-DEXTROAMPHETAMINE 20 MG PO TABS: 20.0000 mg | ORAL_TABLET | Freq: Two times a day (BID) | ORAL | 0 refills | Status: DC | PRN

## 2023-10-23 NOTE — Patient Instructions (Addendum)

## 2023-10-23 NOTE — Telephone Encounter (Signed)
 Copied from CRM #[id number]. Topic: General - Other >> Oct 23, 2023  8:49 AM [place] C wrote: Reason for CRM: The patient has returned a missed cal from ASABRA Fontana CMA  Please contact again when possible.

## 2023-10-23 NOTE — Progress Notes (Addendum)
 Subjective:    Patient ID: [name], female    DOB: 02/19/1975, 48 y.o.   MRN: [id number]  [name] is a 48 y.o. female presenting on 10/23/2023 for ADHD   Virtual / Telehealth Encounter - Video Visit via MyChart The purpose of this virtual visit is to provide medical care while limiting exposure to the novel coronavirus (COVID19) for both patient and office staff.  Consent was obtained for remote visit:  Yes.   Answered questions that patient had about telehealth interaction:  Yes.   I discussed the limitations, risks, security and privacy concerns of performing an evaluation and management service by video/telephone. I also discussed with the patient that there may be a patient responsible charge related to this service. The patient expressed understanding and agreed to proceed.  Patient Location: Home Provider Location: [facility] (Office)  Participants in virtual visit: - Patient: [name] - CMA: Alan Fontana CMA - Provider: Dr Edman   HPI  Discussed the use of AI scribe software for clinical note transcription with the patient, who gave verbal consent to proceed.  History of Present Illness   [name] is a 48 year old female with ADHD who presents with side effects from Vyvanse  and requests medication adjustment.  Stimulant-associated adverse effects - Headaches and hand tremors with current Vyvanse  30 mg use - Similar side effects previously experienced with Vyvanse  a few years ago  Attention deficit hyperactivity disorder symptom management Bipolar depression history - Previously used Adderall XR, which did not provide sufficient duration of effect during work hours, resulting in restlessness and need for breaks - She has tried Focalin  XR in past as well ineffective - Adderall IR 30 mg twice daily was effective, but has not been used for approximately 2.5 to 3 years due to insurance issues - Does not take ADHD medication  daily; skips doses on non-work days (e.g., weekends) to maintain medication effectiveness - Took Vyvanse  today due to work obligations; often reduces or skips doses when not working  Followed by Psychiatry [facility] for Bipolar depression, they are not able to manage her ADHD at this time.     09/19/2023    9:33 AM 09/13/2023    8:59 AM  Depression screen PHQ 2/9  Decreased Interest 0 1  Down, Depressed, Hopeless 0 0  PHQ - 2 Score 0 1  Altered sleeping 0 0  Tired, decreased energy 1 1  Change in appetite 1 1  Feeling bad or failure about yourself  0 0  Trouble concentrating 1 3  Moving slowly or fidgety/restless 0 3  Suicidal thoughts 0 0  PHQ-9 Score 3 9  Difficult doing work/chores Not difficult at all Somewhat difficult       09/19/2023    9:33 AM 09/13/2023    9:00 AM  GAD 7 : Generalized Anxiety Score  Nervous, Anxious, on Edge 0 0  Control/stop worrying 0 0  Worry too much - different things 0 0  Trouble relaxing 0 1  Restless 0 2  Easily annoyed or irritable 0 2  Afraid - awful might happen 0 0  Total GAD 7 Score 0 5  Anxiety Difficulty  Somewhat difficult    Social History   Tobacco Use   Smoking status: Every Day    Current packs/day: 1.00    Average packs/day: 1 pack/day for 16.0 years (16.0 ttl pk-yrs)    Types: Cigarettes   Smokeless tobacco: Never   Tobacco comments:  given info about patches and 2 wk free supply after discharge  Vaping Use   Vaping status: Never Used  Substance Use Topics   Alcohol use: Yes    Comment: 48 oz beer, 1 bottle of wine, close to a 1/5th of vodka    Drug use: Not Currently    Types: Marijuana    Comment: THC- occ use     Review of Systems Per HPI unless specifically indicated above     Objective:    There were no vitals taken for this visit.  Wt Readings from Last 3 Encounters:  09/19/23 204 lb 4 oz (92.6 kg)  09/13/23 203 lb 4 oz (92.2 kg)  01/21/17 215 lb (97.5 kg)     Physical Exam  Note  examination was completely remotely via video observation objective data only  Gen - well-appearing, no acute distress or apparent pain, comfortable HEENT - eyes appear clear without discharge or redness Heart/Lungs - cannot examine virtually - observed no evidence of coughing or labored breathing. Abd - cannot examine virtually  Skin - face visible today- no rash Neuro - awake, alert, oriented Psych - not anxious appearing   Results for orders placed or performed in visit on 09/19/23  POC COVID-19 BinaxNow   Collection Time: 09/19/23 10:00 AM  Result Value Ref Range   SARS Coronavirus 2 Ag Negative Negative      Assessment & Plan:   Problem List Items Addressed This Visit     Bipolar II disorder (HCC) (Chronic)   Other Visit Diagnoses       Attention deficit hyperactivity disorder (ADHD), predominantly inattentive type    -  Primary   Relevant Medications   amphetamine-dextroamphetamine (ADDERALL) 20 MG tablet (Start on 12/18/2023)   amphetamine-dextroamphetamine (ADDERALL) 20 MG tablet (Start on 11/20/2023)   amphetamine-dextroamphetamine (ADDERALL) 20 MG tablet        Attention-deficit hyperactivity disorder (ADHD) with adverse effects of Vyvanse  (lisdexamfetamine) Bipolar Depression history ADHD previously managed with Vyvanse  30 mg, but experiencing adverse effects including headaches and hand tremors. Better response to Adderall immediate release noted. - Note previously on Focalin  XR  Followed by [facility] Psych[facility] location), they are not managing ADHD, but they are managing her other mental health conditions. Bipolar depression  Discussed dose, she requested 30mg  IR TWICE A DAY on adderall as she took this before, I advised that we can start lower and adjust accordingly. She will need to continue follow-up with psychiatry for me to manage this.  - Initiate Adderall immediate release 20 mg twice daily. 60 pills, 3 rx fill dates 10/20, 11/17,  12/15 Consider dose increase adjustment accordingly pending progress within 3 months - Coordinate with mental health provider at [facility]  - Advise reduced medication use on weekends to enhance efficacy. - Instruct to discuss ADHD medication plan with mental health provider at [facility]      No orders of the defined types were placed in this encounter.   Meds ordered this encounter  Medications   amphetamine-dextroamphetamine (ADDERALL) 20 MG tablet    Sig: Take 1 tablet (20 mg total) by mouth 2 (two) times daily as needed (inattention).    Dispense:  60 tablet    Refill:  0    First fill 12/18/2023   amphetamine-dextroamphetamine (ADDERALL) 20 MG tablet    Sig: Take 1 tablet (20 mg total) by mouth 2 (two) times daily as needed (inattention).    Dispense:  60 tablet    Refill:  0  First fill 11/20/2023   amphetamine-dextroamphetamine (ADDERALL) 20 MG tablet    Sig: Take 1 tablet (20 mg total) by mouth 2 (two) times daily as needed (inattention).    Dispense:  60 tablet    Refill:  0    First fill 10/23/2023    Follow up plan: Return if symptoms worsen or fail to improve.   Patient verbalizes understanding with the above medical recommendations including the limitation of remote medical advice.  Specific follow-up and call-back criteria were given for patient to follow-up or seek medical care more urgently if needed.  Total duration of direct patient care provided via video conference: 7 minutes   Marsa Officer, DO [facility] 10/23/2023, 4:20 PM

## 2023-10-23 NOTE — Telephone Encounter (Signed)
Appointment made to discuss medication

## 2023-10-23 NOTE — Telephone Encounter (Signed)
 Spoke with patient appointment scheduled

## 2023-10-23 NOTE — Telephone Encounter (Signed)
 Left message for patient to return call OK to advise

## 2023-11-10 ENCOUNTER — Encounter: Payer: Self-pay | Admitting: Family Medicine

## 2023-11-10 ENCOUNTER — Ambulatory Visit (INDEPENDENT_AMBULATORY_CARE_PROVIDER_SITE_OTHER): Payer: MEDICAID | Admitting: Family Medicine

## 2023-11-10 VITALS — BP 130/82 | HR 84 | Ht 67.0 in | Wt 195.4 lb

## 2023-11-10 DIAGNOSIS — Z1211 Encounter for screening for malignant neoplasm of colon: Secondary | ICD-10-CM | POA: Diagnosis not present

## 2023-11-10 DIAGNOSIS — Z1231 Encounter for screening mammogram for malignant neoplasm of breast: Secondary | ICD-10-CM | POA: Diagnosis not present

## 2023-11-10 DIAGNOSIS — R7309 Other abnormal glucose: Secondary | ICD-10-CM | POA: Diagnosis not present

## 2023-11-10 DIAGNOSIS — E782 Mixed hyperlipidemia: Secondary | ICD-10-CM

## 2023-11-10 DIAGNOSIS — F900 Attention-deficit hyperactivity disorder, predominantly inattentive type: Secondary | ICD-10-CM

## 2023-11-10 DIAGNOSIS — F3181 Bipolar II disorder: Secondary | ICD-10-CM

## 2023-11-10 DIAGNOSIS — Z0000 Encounter for general adult medical examination without abnormal findings: Secondary | ICD-10-CM

## 2023-11-10 NOTE — Patient Instructions (Addendum)
 Thank you for coming to the office today.  New rx Adderall XR 25mg  higher dose sent in for 11/20/23 Monday [facility]  -----------------  For Mammogram screening for breast cancer   Call the Imaging Center below anytime to schedule your own appointment now that order has been placed.  [facility] [address], [address] [place], [place] [address] Phone: [phone]  ---------------------------  Colon Cancer Screening: Ordered the Cologuard (home kit) test for colon cancer screening. Stay tuned for further updates.  It will be shipped to you directly. If not received in 2-4 weeks, call us  or the company.   If you send it back and no results are received in 2-4 weeks, call us  or the company as well!   Colon Cancer Screening: - For all adults age 47+ routine colon cancer screening is highly recommended.     - Recent guidelines from American Cancer Society recommend starting age of 21 - Early detection of colon cancer is important, because often there are no warning signs or symptoms, also if found early usually it can be cured. Late stage is hard to treat.   - If Cologuard is NEGATIVE, then it is good for 3 years before next due - If Cologuard is POSITIVE, then it is strongly advised to get a Colonoscopy, which allows the GI doctor to locate the source of the cancer or polyp (even very early stage) and treat it by removing it. ------------------------- Follow instructions to collect sample, you may call the company for any help or questions, 24/7 telephone support at [phone].  Estroven natural supplement for post-menopausal symptoms, try the Complete Multi System version     Please schedule a Follow-up Appointment to: Return for 3 month follow-up ADHD.  If you have any other questions or concerns, please feel free to call the office or send a message through MyChart. You may also schedule an earlier  appointment if necessary.  Additionally, you may be receiving a survey about your experience at our office within a few days to 1 week by e-mail or mail. We value your feedback.  Marsa Officer, DO [facility] [place]

## 2023-11-10 NOTE — Progress Notes (Signed)
 Subjective:    Patient ID: [name], female    DOB: [birth year], 48 y.o.   MRN: [id number]  [name] is a 48 y.o. female presenting on 11/10/2023 for Annual Exam   HPI  Discussed the use of AI scribe software for clinical note transcription with the patient, who gave verbal consent to proceed.  History of Present Illness   [name] is a 48 year old female who presents for an annual physical exam.  Preventive health maintenance - Annual physical examination. - No mammogram in the past 1-2 years; last performed at [facility], typically done annually. - No prior colon cancer screening; considering Cologuard test. - Last Pap smear approximately three years ago at [facility]. - Considering influenza vaccine but has decided to pause for now. - Eligible for pneumococcal and shingles vaccines due to age, but not ready to receive them.  Menopausal symptoms - Hot flashes present since hysterectomy in 2005 or 2006. - No significant worsening of hot flashes. - No use of supplements or hormone replacement therapy for symptoms. - Active tobacco use, which increases risk of complications with hormone replacement therapy.  Gynecologic history - History of hysterectomy in 2005 or 2006; cervix and ovaries retained.   Attention deficit hyperactivity disorder symptom management Bipolar depression history - Previously used Adderall XR, which did not provide sufficient duration of effect during work hours, resulting in restlessness and need for breaks - She has tried Focalin  XR in past as well ineffective - Adderall IR 30 mg twice daily was effective, but has not been used for approximately 2.5 to 3 years due to insurance issues - Does not take ADHD medication daily; skips doses on non-work days (e.g., weekends) to maintain medication effectiveness - Currently taking Adderall 20 mg; finds it more effective than Vyvanse . - Vyvanse  previously caused  tremors.   Followed by Psychiatry [facility] for Bipolar depression, they are not able to manage her ADHD at this time.  Depression partial remission / Mood disturbance and bipolar features - History of depression following the loss of her son - Diagnosed with bipolar features by a psychiatrist years prior - Currently taking Abilify  15 mg and sertraline (Zoloft) for mood stabilization - Mood has been stable recently with occasional irritability - No significant depressive symptoms at present - Previously discontinued Zoloft due to feeling well, but resumed after mood decline - No recent follow-up with a mental health provider for approximately one year; has been obtaining medication refills through [facility] in [facility]   Attention deficit and focus issues - Difficulty focusing at work and at home - Previously treated with Focalin , which was effective, but discontinued due to loss of insurance - Prior trials of Vyvanse  and Adderall were less effective than Focalin  - Interested in restarting Focalin  for focus issues   Alcohol Use - Occasional alcohol use, currently limited to a couple of drinks on weekends - History of more frequent alcohol use in the past - No symptoms of alcohol dependence or withdrawal   Tobacco use and respiratory symptoms - Current smoker - Recent cough localized to the chest - No other respiratory symptoms reported   Health Maintenance: Decline Flu shot and Prevnar-20  Ordered Cologuard, initial colon cancer screening, no prior testing.  Last mammogram >1 year ago at [facility] Mammogram screening, she will call to schedule  Last Pap Smear cervical cancer screening 3 years ago   [facility]  [address]  [place], [place]  [address] Main: [phone] Fax: [phone]  Request copy     11/10/2023    9:09 AM 09/19/2023    9:33 AM 09/13/2023    8:59 AM  Depression screen PHQ 2/9  Decreased  Interest 0 0 1  Down, Depressed, Hopeless 1 0 0  PHQ - 2 Score 1 0 1  Altered sleeping 0 0 0  Tired, decreased energy 1 1 1   Change in appetite 1 1 1   Feeling bad or failure about yourself  0 0 0  Trouble concentrating 1 1 3   Moving slowly or fidgety/restless 1 0 3  Suicidal thoughts 0 0 0  PHQ-9 Score 5 3  9    Difficult doing work/chores Not difficult at all Not difficult at all Somewhat difficult     Data saved with a previous flowsheet row definition       11/10/2023    9:09 AM 09/19/2023    9:33 AM 09/13/2023    9:00 AM  GAD 7 : Generalized Anxiety Score  Nervous, Anxious, on Edge 0 0 0  Control/stop worrying 0 0 0  Worry too much - different things 0 0 0  Trouble relaxing 0 0 1  Restless 1 0 2  Easily annoyed or irritable 0 0 2  Afraid - awful might happen 0 0 0  Total GAD 7 Score 1 0 5  Anxiety Difficulty Not difficult at all  Somewhat difficult     Past Medical History:  Diagnosis Date   Anxiety    per pt report    Bipolar disorder (HCC)    per pt report   Depression    Mental disorder    Past Surgical History:  Procedure Laterality Date   ABDOMINAL HYSTERECTOMY  2006   Partial. She still has cervix and both ovaries.   CHOLECYSTECTOMY     Social History   Socioeconomic History   Marital status: Married    Spouse name: Not on file   Number of children: Not on file   Years of education: Not on file   Highest education level: Not on file  Occupational History   Not on file  Tobacco Use   Smoking status: Every Day    Current packs/day: 1.00    Average packs/day: 1 pack/day for 16.0 years (16.0 ttl pk-yrs)    Types: Cigarettes   Smokeless tobacco: Never   Tobacco comments:    given info about patches and 2 wk free supply after discharge  Vaping Use   Vaping status: Never Used  Substance and Sexual Activity   Alcohol use: Yes    Comment: 48 oz beer, 1 bottle of wine, close to a 1/5th of vodka    Drug use: Not Currently    Types: Marijuana     Comment: THC- occ use    Sexual activity: Yes    Birth control/protection: Surgical  Other Topics Concern   Not on file  Social History Narrative   Not on file   Social Drivers of Health   Financial Resource Strain: High Risk (07/28/2021)   Received from [facility]   Overall Financial Resource Strain (CARDIA)    Difficulty of Paying Living Expenses: Hard  Food Insecurity: No Food Insecurity (07/28/2021)   Received from [facility]   Hunger Vital Sign    Within the past 12 months, you worried that your food would run out before you got the money to buy more.: Never true  Within the past 12 months, the food you bought just didn't last and you didn't have money to get more.: Never true  Transportation Needs: No Transportation Needs (07/28/2020)   Received from [facility] - Transportation    Lack of Transportation (Medical): No    Lack of Transportation (Non-Medical): No  Physical Activity: Inactive (07/28/2021)   Received from [facility]   Exercise Vital Sign    On average, how many days per week do you engage in moderate to strenuous exercise (like a brisk walk)?: 0 days    On average, how many minutes do you engage in exercise at this level?: 0 min  Stress: Stress Concern Present (07/28/2020)   Received from [facility] - Occupational Stress Questionnaire    Feeling of Stress : Rather much  Social Connections: Socially Isolated (07/28/2021)   Received from [facility]   Social Connection and Isolation Panel    In a typical week, how many times do you talk on the phone with family, friends, or neighbors?: More than three times a week    How often do you get together with friends or relatives?: More than three times a week    How often do you attend church or religious services?: Never    Do you belong to any clubs or organizations such as church groups, unions, fraternal or athletic groups, or school  groups?: No    How often do you attend meetings of the clubs or organizations you belong to?: Never    Are you married, widowed, divorced, separated, never married, or living with a partner?: Separated  Intimate Partner Violence: Not At Risk (07/28/2021)   Received from [facility]   Humiliation, Afraid, Rape, and Kick questionnaire    Within the last year, have you been afraid of your partner or ex-partner?: No    Within the last year, have you been humiliated or emotionally abused in other ways by your partner or ex-partner?: No    Within the last year, have you been kicked, hit, slapped, or otherwise physically hurt by your partner or ex-partner?: No    Within the last year, have you been raped or forced to have any kind of sexual activity by your partner or ex-partner?: No   Family History  Problem Relation Age of Onset   Heart attack Mother    Melanoma Father    Healthy Sister    Brain cancer Son    Current Outpatient Medications on File Prior to Visit  Medication Sig   albuterol  (VENTOLIN  HFA) 108 (90 Base) MCG/ACT inhaler Inhale 2 puffs into the lungs every 6 (six) hours as needed for wheezing or shortness of breath.   ARIPiprazole  (ABILIFY ) 15 MG tablet Take 1 tablet (15 mg total) by mouth daily. For mood stability   sertraline (ZOLOFT) 50 MG tablet Take 50 mg by mouth daily.   No current facility-administered medications on file prior to visit.    Review of Systems  Constitutional:  Negative for activity change, appetite change, chills, diaphoresis, fatigue and fever.  HENT:  Negative for congestion and hearing loss.   Eyes:  Negative for visual disturbance.  Respiratory:  Negative for cough, chest tightness, shortness of breath and wheezing.   Cardiovascular:  Negative for chest pain, palpitations and leg swelling.  Gastrointestinal:  Negative for abdominal pain, constipation, diarrhea, nausea and vomiting.  Genitourinary:  Negative for dysuria, frequency and hematuria.  Musculoskeletal:  Negative for arthralgias and neck pain.  Skin:  Negative for rash.  Neurological:  Negative for dizziness, weakness, light-headedness, numbness and headaches.  Hematological:  Negative for adenopathy.  Psychiatric/Behavioral:  Negative for behavioral problems, dysphoric mood and sleep disturbance.    Per HPI unless specifically indicated above     Objective:    BP 130/82 (BP Location: Right Arm, Patient Position: Sitting, Cuff Size: Normal)   Pulse 84   Ht 5' 7 (1.702 m)   Wt 195 lb 6 oz (88.6 kg)   SpO2 98%   BMI 30.60 kg/m   Wt Readings from Last 3 Encounters:  11/10/23 195 lb 6 oz (88.6 kg)  09/19/23 204 lb 4 oz (92.6 kg)  09/13/23 203 lb 4 oz (92.2 kg)    Physical Exam Vitals and nursing note reviewed.  Constitutional:      General: She is not in acute distress.    Appearance: She is well-developed. She is not diaphoretic.     Comments: Well-appearing, comfortable, cooperative  HENT:     Head: Normocephalic and atraumatic.  Eyes:     General:        Right eye: No discharge.        Left eye: No discharge.     Conjunctiva/sclera: Conjunctivae normal.     Pupils: Pupils are equal, round, and reactive to light.  Neck:     Thyroid: No thyromegaly.     Vascular: No carotid bruit.  Cardiovascular:     Rate and Rhythm: Normal rate and regular rhythm.     Pulses: Normal pulses.     Heart sounds: Normal heart sounds. No murmur heard. Pulmonary:     Effort: Pulmonary effort is normal. No respiratory distress.     Breath sounds: Normal breath sounds. No wheezing or rales.  Abdominal:     General: Bowel sounds are normal. There is no distension.     Palpations: Abdomen is soft. There is no mass.     Tenderness: There is no abdominal tenderness.  Musculoskeletal:        General: No tenderness. Normal range of motion.     Cervical back: Normal range of motion and neck supple.     Right lower leg: No edema.     Left lower leg: No edema.     Comments:  Upper / Lower Extremities: - Normal muscle tone, strength bilateral upper extremities 5/5, lower extremities 5/5  Lymphadenopathy:     Cervical: No cervical adenopathy.  Skin:    General: Skin is warm and dry.     Findings: No erythema or rash.  Neurological:     Mental Status: She is alert and oriented to person, place, and time.     Comments: Distal sensation intact to light touch all extremities  Psychiatric:        Mood and Affect: Mood normal.        Behavior: Behavior normal.        Thought Content: Thought content normal.     Comments: Well groomed, good eye contact, normal speech and thoughts     Results for orders placed or performed in visit on 11/10/23  TSH   Collection Time: 11/10/23  9:48 AM  Result Value Ref Range   TSH 0.87 mIU/L  Lipid panel   Collection Time: 11/10/23  9:48 AM  Result Value Ref Range   Cholesterol 251 (H) <200 mg/dL   HDL 57 > OR = 50 mg/dL   Triglycerides 813 (H) <150  mg/dL   LDL Cholesterol (Calc) 160 (H) mg/dL (calc)   Total CHOL/HDL Ratio 4.4 <5.0 (calc)   Non-HDL Cholesterol (Calc) 194 (H) <130 mg/dL (calc)  CBC with Differential/Platelet   Collection Time: 11/10/23  9:48 AM  Result Value Ref Range   WBC 11.3 (H) 3.8 - 10.8 Thousand/uL   RBC 4.37 3.80 - 5.10 Million/uL   Hemoglobin 14.1 11.7 - 15.5 g/dL   HCT 58.1 64.9 - 54.9 %   MCV 95.7 80.0 - 100.0 fL   MCH 32.3 27.0 - 33.0 pg   MCHC 33.7 32.0 - 36.0 g/dL   RDW 87.0 88.9 - 84.9 %   Platelets 344 140 - 400 Thousand/uL   MPV 10.2 7.5 - 12.5 fL   Neutro Abs 7,447 1,500 - 7,800 cells/uL   Absolute Lymphocytes 2,667 850 - 3,900 cells/uL   Absolute Monocytes 712 200 - 950 cells/uL   Eosinophils Absolute 418 15 - 500 cells/uL   Basophils Absolute 57 0 - 200 cells/uL   Neutrophils Relative % 65.9 %   Total Lymphocyte 23.6 %   Monocytes Relative 6.3 %   Eosinophils Relative 3.7 %   Basophils Relative 0.5 %  Comprehensive metabolic panel with GFR   Collection Time: 11/10/23  9:48  AM  Result Value Ref Range   Glucose, Bld 100 (H) 65 - 99 mg/dL   BUN 8 7 - 25 mg/dL   Creat 9.38 9.49 - 8.96 mg/dL   eGFR 890 > OR = 60 fO/fpw/8.26f7   BUN/Creatinine Ratio SEE NOTE: 6 - 22 (calc)   Sodium 136 135 - 146 mmol/L   Potassium 3.8 3.5 - 5.3 mmol/L   Chloride 99 98 - 110 mmol/L   CO2 25 20 - 32 mmol/L   Calcium 9.9 8.6 - 10.4 mg/dL   Total Protein 7.4 6.1 - 8.1 g/dL   Albumin 4.5 3.6 - 5.1 g/dL   Globulin 2.9 1.9 - 3.7 g/dL (calc)   AG Ratio 1.6 1.0 - 2.5 (calc)   Total Bilirubin 0.4 0.2 - 1.2 mg/dL   Alkaline phosphatase (APISO) 77 37 - 153 U/L   AST 32 10 - 35 U/L   ALT 41 (H) 6 - 29 U/L      Assessment & Plan:   Problem List Items Addressed This Visit     Bipolar II disorder (HCC) (Chronic)   Other Visit Diagnoses       Annual physical exam    -  Primary   Relevant Orders   TSH (Completed)   Lipid panel (Completed)   Hemoglobin A1c   CBC with Differential/Platelet (Completed)   Comprehensive metabolic panel with GFR (Completed)     Encounter for screening mammogram for malignant neoplasm of breast       Relevant Orders   MM 3D SCREENING MAMMOGRAM BILATERAL BREAST     Screening for colon cancer       Relevant Orders   Cologuard     Abnormal glucose       Relevant Orders   Hemoglobin A1c     Mixed hyperlipidemia       Relevant Orders   TSH (Completed)   Lipid panel (Completed)     Attention deficit hyperactivity disorder (ADHD), predominantly inattentive type       Relevant Medications   amphetamine-dextroamphetamine (ADDERALL) 30 MG tablet (Start on 11/19/2024)        Updated Health Maintenance information Reviewed recent lab results with patient Encouraged improvement to lifestyle with diet and exercise Goal of  weight loss  Adult Wellness Visit Annual physical examination conducted. Discussed cancer screenings, mammogram, colon cancer screening, and Pap smear history. Blood work ordered for comprehensive health assessment. - Ordered  mammogram through [facility]. - Ordered Cologuard test for colon cancer screening. - Requested records of last Pap smear from previous provider. - Ordered blood work for cholesterol, blood sugar, kidney, liver, blood counts, and thyroid.  Postmenopausal symptoms (hot flashes) Symptoms worsening post-hysterectomy. Discussed hormone replacement therapy risks due to smoking. Recommended non-hormonal supplements. - Recommended Estroven multisystem version for postmenopausal symptoms.  Mixed hyperlipidemia Cholesterol levels previously elevated. Blood work ordered to assess current levels. - Ordered blood work to assess cholesterol levels.  Other abnormal glucose Blood work ordered to assess glucose levels. - Ordered blood work to assess glucose levels.  Attention-deficit/hyperactivity disorder (ADHD) Adderall 20 mg effective, but increase desired. Vyvanse  caused adverse effects. Plan to increase Adderall dosage and reassess. - Increased Adderall to 30 mg. [place]  Discontinue 20mg  dose future refills - Scheduled follow-up appointment in three months to assess response to increased dosage.  General Health Maintenance Discussed vaccinations and cancer screenings. Flu vaccine skipped, tetanus due next year. Pneumonia and shingles vaccines recommended. - Recommended pneumonia and shingles vaccines at a later date. - Ordered mammogram through [facility]. - Ordered Cologuard test for colon cancer screening. - Requested records of last Pap smear from previous provider.       Orders Placed This Encounter  Procedures   MM 3D SCREENING MAMMOGRAM BILATERAL BREAST    Standing Status:   Future    Expiration Date:   11/09/2024    Reason for Exam (SYMPTOM  OR DIAGNOSIS REQUIRED):   Screening bilateral 3D Mammogram Tomo    Preferred imaging location?:   [facility]   Cologuard   TSH   Lipid panel    Has the patient fasted?:   Yes   Hemoglobin A1c    CBC with Differential/Platelet   Comprehensive metabolic panel with GFR    Has the patient fasted?:   Yes    Meds ordered this encounter  Medications   amphetamine-dextroamphetamine (ADDERALL) 30 MG tablet    Sig: Take 1 tablet by mouth 2 (two) times daily.    Dispense:  60 tablet    Refill:  0    Please discontinue all previous rx Adderall 20mg . Replace with new rx Adderall 30mg  fill on 11/20/23     Follow up plan: Return for 3 month follow-up ADHD.  Marsa Officer, DO [facility] 11/10/2023, 9:22 AM

## 2023-11-11 LAB — CBC WITH DIFFERENTIAL/PLATELET
Absolute Lymphocytes: 2667 {cells}/uL (ref 850–3900)
Absolute Monocytes: 712 {cells}/uL (ref 200–950)
Basophils Absolute: 57 {cells}/uL (ref 0–200)
Basophils Relative: 0.5 %
Eosinophils Absolute: 418 {cells}/uL (ref 15–500)
Eosinophils Relative: 3.7 %
HCT: 41.8 % (ref 35.0–45.0)
Hemoglobin: 14.1 g/dL (ref 11.7–15.5)
MCH: 32.3 pg (ref 27.0–33.0)
MCHC: 33.7 g/dL (ref 32.0–36.0)
MCV: 95.7 fL (ref 80.0–100.0)
MPV: 10.2 fL (ref 7.5–12.5)
Monocytes Relative: 6.3 %
Neutro Abs: 7447 {cells}/uL (ref 1500–7800)
Neutrophils Relative %: 65.9 %
Platelets: 344 Thousand/uL (ref 140–400)
RBC: 4.37 Million/uL (ref 3.80–5.10)
RDW: 12.9 % (ref 11.0–15.0)
Total Lymphocyte: 23.6 %
WBC: 11.3 Thousand/uL — ABNORMAL HIGH (ref 3.8–10.8)

## 2023-11-11 LAB — COMPREHENSIVE METABOLIC PANEL WITH GFR
AG Ratio: 1.6 (calc) (ref 1.0–2.5)
ALT: 41 U/L — ABNORMAL HIGH (ref 6–29)
AST: 32 U/L (ref 10–35)
Albumin: 4.5 g/dL (ref 3.6–5.1)
Alkaline phosphatase (APISO): 77 U/L (ref 37–153)
BUN: 8 mg/dL (ref 7–25)
CO2: 25 mmol/L (ref 20–32)
Calcium: 9.9 mg/dL (ref 8.6–10.4)
Chloride: 99 mmol/L (ref 98–110)
Creat: 0.61 mg/dL (ref 0.50–1.03)
Globulin: 2.9 g/dL (ref 1.9–3.7)
Glucose, Bld: 100 mg/dL — ABNORMAL HIGH (ref 65–99)
Potassium: 3.8 mmol/L (ref 3.5–5.3)
Sodium: 136 mmol/L (ref 135–146)
Total Bilirubin: 0.4 mg/dL (ref 0.2–1.2)
Total Protein: 7.4 g/dL (ref 6.1–8.1)
eGFR: 109 mL/min/1.73m2 (ref >=60)

## 2023-11-11 LAB — LIPID PANEL
Cholesterol: 251 mg/dL — ABNORMAL HIGH (ref <200)
HDL: 57 mg/dL (ref >=50)
LDL Cholesterol (Calc): 160 mg/dL — ABNORMAL HIGH
Non-HDL Cholesterol (Calc): 194 mg/dL — ABNORMAL HIGH (ref <130)
Total CHOL/HDL Ratio: 4.4 (calc) (ref <5.0)
Triglycerides: 186 mg/dL — ABNORMAL HIGH (ref <150)

## 2023-11-11 LAB — HEMOGLOBIN A1C
Hgb A1c MFr Bld: 5.7 % — ABNORMAL HIGH (ref <5.7)
Mean Plasma Glucose: 117 mg/dL
eAG (mmol/L): 6.5 mmol/L

## 2023-11-11 LAB — TSH: TSH: 0.87 m[IU]/L

## 2023-11-11 MED ORDER — AMPHETAMINE-DEXTROAMPHETAMINE 30 MG PO TABS: 30.0000 mg | ORAL_TABLET | Freq: Two times a day (BID) | ORAL | 0 refills | Status: DC

## 2023-11-14 ENCOUNTER — Telehealth: Payer: Self-pay | Admitting: Family Medicine

## 2023-11-14 NOTE — Telephone Encounter (Signed)
 Called [facility] and discontinued previous Adderall XR 20mg  and Vyvanse  rx. She has Adderall XR 30mg  order at pharmacy for pick up on 11/17  Marsa Officer, DO [facility] 11/14/2023, 5:37 PM

## 2023-11-16 ENCOUNTER — Ambulatory Visit: Payer: Self-pay | Admitting: Family Medicine

## 2023-11-24 ENCOUNTER — Ambulatory Visit: Payer: Self-pay

## 2023-11-24 ENCOUNTER — Telehealth (INDEPENDENT_AMBULATORY_CARE_PROVIDER_SITE_OTHER): Payer: MEDICAID

## 2023-11-24 DIAGNOSIS — F419 Anxiety disorder, unspecified: Secondary | ICD-10-CM | POA: Insufficient documentation

## 2023-11-24 NOTE — Progress Notes (Signed)
            Progress Note  Physician: Wymon Swaney A Naiomy Watters, MD   Patient contacted 11/24/23 at  9:40 AM EST by a video enabled telemedicine application and verified that I am speaking with the correct person.   Patient is aware of limitations of evaluation by telemedicine The patient expressed understanding and agreed to proceed.   HPI: [name] is a 48 y.o. female presenting on 11/24/2023 for No chief complaint on file. . Discussed the use of AI scribe software for clinical note transcription with the patient, who gave verbal consent to proceed.  History of Present Illness   [name] is a 48 year old female with anxiety and depression who presents with increased anxiety.  Anxiety and mood symptoms - Increased anxiety over the past few days - History of anxiety and depression - Currently taking sertraline (Zoloft) 50 mg daily - Diagnosis of bipolar disorder with regular psychiatric follow-up  Attention deficit symptoms - Prescribed Adderall for attention disorder by PCP - Has not taken Adderall for several days  Alcohol use - Alcohol consumption multiple times weekly    Social history:  Relevant past medical, surgical, family and social history reviewed and updated as indicated. Interim medical history since our last visit reviewed.  Allergies and medications reviewed and updated.  DATA REVIEWED: CHART IN EPIC  ROS: Negative unless specifically indicated above in HPI.    Current Outpatient Medications:    albuterol  (VENTOLIN  HFA) 108 (90 Base) MCG/ACT inhaler, Inhale 2 puffs into the lungs every 6 (six) hours as needed for wheezing or shortness of breath., Disp: 8 g, Rfl: 0   [START ON 11/19/2024] amphetamine -dextroamphetamine  (ADDERALL) 30 MG tablet, Take 1 tablet by mouth 2 (two) times daily., Disp: 60 tablet, Rfl: 0   sertraline (ZOLOFT) 50 MG tablet, Take 50 mg by mouth daily., Disp: , Rfl:       Objective:  Telephone visit     Assessment & Plan:   Anxiety     No follow-ups on file.  Assessment and Plan    Anxiety in patient with bipolar disorder and depression Anxiety increased recently. Zoloft 50 mg daily.  Benzodiazepines or similar not advised due to Adderall and overall polypharmacy.   - Discuss anxiety management with primary care provider - ideally psychiatrist.  Alcohol use, multiple times weekly

## 2023-11-24 NOTE — Telephone Encounter (Signed)
 FYI Only or Action Required?: FYI only for provider: appointment scheduled on 11/24/23.  Patient was last seen in primary care on 11/10/2023 by Edman Marsa PARAS, DO.  Called Nurse Triage reporting Anxiety.  Symptoms began several days ago.  Interventions attempted: Prescription medications: Trazodone , Zoloft; abstained from coffee this morning.  Symptoms are: anxiety, stomach aches and diarrheagradually worsening.  Triage Disposition: See Physician Within 24 Hours (overriding See PCP When Office is Open (Within 3 Days))  Patient/caregiver understands and will follow disposition?: Yes          Copied from CRM #[id number]. Topic: Clinical - Red Word Triage >> Nov 24, 2023  8:18 AM Rosaria BRAVO wrote: Red Word that prompted transfer to Nurse Triage: Anxiety yesterday, not getting any better. First time experience. Reason for Disposition  MODERATE anxiety (e.g., persistent or frequent anxiety symptoms; interferes with sleep, school, or work)  Answer Assessment - Initial Assessment Questions 1. CONCERN: Did anything happen that prompted you to call today?      Patient states she woke up yesterday morning and felt great and within an hour started feeling anxious. She states she felt bad all day and it happened again today. She states she took her trazodone  and zoloft and it has not helped.  2. ANXIETY SYMPTOMS: Can you describe how you (your loved one; patient) have been feeling? (e.g., tense, restless, panicky, anxious, keyed up, overwhelmed, sense of impending doom).      Just anxious, not overwhelmed. Makes me feel funny. My stomach hurts  3. ONSET: How long have you been feeling this way? (e.g., hours, days, weeks)     Yesterday.  4. SEVERITY: How would you rate the level of anxiety? (e.g., 0 - 10; or mild, moderate, severe).     She states she was at a 10 and trazodone  brought her down to 7-8/10.  5. FUNCTIONAL IMPAIRMENT: How have these feelings affected your  ability to do daily activities? Have you had more difficulty than usual doing your normal daily activities? (e.g., getting better, same, worse; self-care, school, work, interactions)     She states she has not been able to do normal activities. She states she had to leave work early last night.  6. HISTORY: Have you felt this way before? Have you ever been diagnosed with an anxiety problem in the past? (e.g., generalized anxiety disorder, panic attacks, PTSD). If Yes, ask: How was this problem treated? 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   7. RISK OF HARM - SUICIDAL IDEATION: Do you ever have thoughts of hurting or killing yourself? If Yes, ask:  Do you have these feelings now? Do you have a plan on how you would do this?     No.  8. TREATMENT:  What has been done so far to treat this anxiety? (e.g., medicines, relaxation strategies). What has helped?     Medications.  9. THERAPIST: Do you have a counselor or therapist? If Yes, ask: What is their name?     Yes at [facility]. She sees a provider but she is unsure what her title is. She states she called and they can't see her til 12/07/23.  10. POTENTIAL TRIGGERS: Do you drink caffeinated beverages (e.g., coffee, colas, teas), and how much daily? Do you drink alcohol or use any drugs? Have  you started any new medicines recently?       Coffee, drinks 3 cups daily. Drinks alcohol occasional and no illegal drug use. No new medications.  11. PATIENT SUPPORT: Who is with you now? Who do you live with? Do you have family or friends who you can talk to?        Boyfriend is with her, and they are out of town. She states she has people she can talk to.  12. OTHER SYMPTOMS: Do you have any other symptoms? (e.g.,  feeling depressed, trouble concentrating, trouble sleeping, trouble breathing, palpitations or fast heartbeat, chest pain, sweating, nausea, or diarrhea)       Diarrhea, abdominal pain stomach ache that comes and goes. Denies nausea, chest pain, difficulty breathing.  13. PREGNANCY: Is there any chance you are pregnant? When was your last menstrual period?       Hysterectomy.  Protocols used: Anxiety and Panic Attack-A-AH

## 2023-12-15 ENCOUNTER — Other Ambulatory Visit: Payer: Self-pay | Admitting: Family Medicine

## 2023-12-15 DIAGNOSIS — F900 Attention-deficit hyperactivity disorder, predominantly inattentive type: Secondary | ICD-10-CM

## 2023-12-15 NOTE — Telephone Encounter (Unsigned)
 Copied from CRM #[id number]. Topic: Clinical - Medication Refill >> Dec 15, 2023  9:27 AM Alexandria E wrote: Medication: amphetamine -dextroamphetamine  (ADDERALL) 30 MG tablet  Has the patient contacted their pharmacy? Yes (Agent: If no, request that the patient contact the pharmacy for the refill. If patient does not wish to contact the pharmacy document the reason why and proceed with request.) (Agent: If yes, when and what did the pharmacy advise?)  This is the patient's preferred pharmacy:    [facility] [address] [place], [place] [facility] [address] [place] [address] Phone: [phone] Fax: [phone]  Is this the correct pharmacy for this prescription? Yes If no, delete pharmacy and type the correct one.   Has the prescription been filled recently? No  Is the patient out of the medication? No, 2 days left.  Has the patient been seen for an appointment in the last year OR does the patient have an upcoming appointment? Yes  Can we respond through MyChart? Yes  Agent: Please be advised that Rx refills may take up to 3 business days. We ask that you follow-up with your pharmacy.

## 2023-12-18 MED ORDER — AMPHETAMINE-DEXTROAMPHETAMINE 30 MG PO TABS: 30.0000 mg | ORAL_TABLET | Freq: Two times a day (BID) | ORAL | 0 refills | Status: AC

## 2023-12-18 NOTE — Telephone Encounter (Signed)
 Requested medication (s) are due for refill today: Yes  Requested medication (s) are on the active medication list: Yes  Last refill:  11/11/23  Future visit scheduled: Yes  Notes to clinic:  Unable to refill per protocol, cannot delegate.      Requested Prescriptions  Pending Prescriptions Disp Refills   amphetamine -dextroamphetamine  (ADDERALL) 30 MG tablet 60 tablet 0    Sig: Take 1 tablet by mouth 2 (two) times daily.     Not Delegated - Psychiatry:  Stimulants/ADHD Failed - 12/18/2023  2:43 PM      Failed - This refill cannot be delegated      Failed - Urine Drug Screen completed in last 360 days      Passed - Last BP in normal range    BP Readings from Last 1 Encounters:  11/10/23 130/82         Passed - Last Heart Rate in normal range    Pulse Readings from Last 1 Encounters:  11/10/23 84         Passed - Valid encounter within last 6 months    Recent Outpatient Visits           3 weeks ago Anxiety   [place] [facility] [place], Parris LABOR, MD   1 month ago Annual physical exam   [place] [facility] Edman Marsa PARAS, DO   1 month ago Attention deficit hyperactivity disorder (ADHD), predominantly inattentive type   [facility] Edman Marsa PARAS, DO   3 months ago COPD with acute exacerbation [facility])   [place] [place], Marsa PARAS, DO   3 months ago Major depressive disorder, recurrent, in partial remission    [facility] [place], Marsa PARAS, [place]

## 2023-12-29 ENCOUNTER — Encounter: Payer: Self-pay | Admitting: Family Medicine

## 2024-01-15 ENCOUNTER — Telehealth: Payer: Self-pay | Admitting: Family Medicine

## 2024-01-15 DIAGNOSIS — F900 Attention-deficit hyperactivity disorder, predominantly inattentive type: Secondary | ICD-10-CM

## 2024-01-15 NOTE — Telephone Encounter (Signed)
 Spoke to patient, she will call pharmacy about refill.

## 2024-01-15 NOTE — Telephone Encounter (Signed)
 Patient needs to contact her pharmacy.  There are future orders on file.  She can fill the next one today 01/15/24. She needs to contact pharmacy for refill.  Next one is February 9th 2026.   Marsa Officer, DO [facility] 01/15/2024, 1:18 PM

## 2024-01-15 NOTE — Telephone Encounter (Signed)
 Copied from CRM #[id number]. Topic: Clinical - Medication Refill >> Jan 15, 2024  9:00 AM Gattis SQUIBB wrote: Medication: Adderall 30 mg  Has the patient contacted their pharmacy? No   [facility] [address] [place], [place] [facility] [address] [place] [address] Phone: [phone] Fax: [phone]  Is this the correct pharmacy for this prescription? Yes If no, delete pharmacy and type the correct one.   Has the prescription been filled recently? Yes  Is the patient out of the medication? No  Has the patient been seen for an appointment in the last year OR does the patient have an upcoming appointment? Yes  Can we respond through MyChart? No  Agent: Please be advised that Rx refills may take up to 3 business days. We ask that you follow-up with your pharmacy.

## 2024-02-12 ENCOUNTER — Ambulatory Visit: Payer: MEDICAID | Admitting: Family Medicine
